# Patient Record
Sex: Female | Born: 1955 | Race: White | Hispanic: No | Marital: Married | Smoking: Never smoker
Health system: Southern US, Community
[De-identification: ages and names within clinical notes are randomized; demographics above are authoritative.]

## PROBLEM LIST (undated history)

## (undated) DIAGNOSIS — F419 Anxiety disorder, unspecified: Secondary | ICD-10-CM

## (undated) DIAGNOSIS — F329 Major depressive disorder, single episode, unspecified: Secondary | ICD-10-CM

## (undated) DIAGNOSIS — M1990 Unspecified osteoarthritis, unspecified site: Secondary | ICD-10-CM

## (undated) DIAGNOSIS — R42 Dizziness and giddiness: Secondary | ICD-10-CM

## (undated) DIAGNOSIS — F32A Depression, unspecified: Secondary | ICD-10-CM

## (undated) DIAGNOSIS — K219 Gastro-esophageal reflux disease without esophagitis: Secondary | ICD-10-CM

## (undated) DIAGNOSIS — I10 Essential (primary) hypertension: Secondary | ICD-10-CM

## (undated) DIAGNOSIS — R519 Headache, unspecified: Secondary | ICD-10-CM

## (undated) DIAGNOSIS — K589 Irritable bowel syndrome without diarrhea: Secondary | ICD-10-CM

## (undated) DIAGNOSIS — E785 Hyperlipidemia, unspecified: Secondary | ICD-10-CM

## (undated) HISTORY — PX: OTHER SURGICAL HISTORY: SHX169

## (undated) HISTORY — PX: ROTATOR CUFF REPAIR: SHX139

## (undated) HISTORY — PX: TUBAL LIGATION: SHX77

---
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
## 1993-07-07 HISTORY — PX: TUBAL LIGATION: SHX77

## 1996-07-07 HISTORY — PX: LEEP: SHX91

## 2010-07-07 HISTORY — PX: KNEE ARTHROSCOPY: SUR90

## 2011-03-13 ENCOUNTER — Ambulatory Visit: Payer: Self-pay | Admitting: General Surgery

## 2012-03-09 ENCOUNTER — Ambulatory Visit: Payer: Self-pay | Admitting: General Surgery

## 2012-03-09 LAB — CREATININE, SERUM
EGFR (African American): >60
EGFR (Non-African Amer.): 56 — ABNORMAL LOW

## 2014-04-13 LAB — BASIC METABOLIC PANEL
BUN: 16 mg/dL (ref 4–21)
Creatinine: 1 mg/dL (ref 0.5–1.1)
Glucose: 95 mg/dL
POTASSIUM: 4 mmol/L (ref 3.4–5.3)
SODIUM: 140 mmol/L (ref 137–147)

## 2014-05-30 LAB — HEPATIC FUNCTION PANEL
ALT: 13 U/L (ref 7–35)
AST: 18 U/L (ref 13–35)
Alkaline Phosphatase: 106 U/L (ref 25–125)

## 2014-05-30 LAB — LIPID PANEL
CHOLESTEROL: 162 mg/dL (ref 0–200)
HDL: 57 mg/dL (ref 35–70)
LDL Cholesterol: 87 mg/dL
LDL/HDL RATIO: 1.5
Triglycerides: 90 mg/dL (ref 40–160)

## 2015-02-08 ENCOUNTER — Telehealth: Payer: Self-pay | Admitting: Family Medicine

## 2015-02-08 NOTE — Telephone Encounter (Signed)
Pt requesting medication 

## 2015-03-26 ENCOUNTER — Ambulatory Visit: Payer: Self-pay | Admitting: Family Medicine

## 2015-03-29 DIAGNOSIS — F329 Major depressive disorder, single episode, unspecified: Secondary | ICD-10-CM | POA: Insufficient documentation

## 2015-03-29 DIAGNOSIS — G4700 Insomnia, unspecified: Secondary | ICD-10-CM | POA: Insufficient documentation

## 2015-03-29 DIAGNOSIS — F419 Anxiety disorder, unspecified: Secondary | ICD-10-CM

## 2015-03-29 DIAGNOSIS — K589 Irritable bowel syndrome without diarrhea: Secondary | ICD-10-CM | POA: Insufficient documentation

## 2015-03-29 DIAGNOSIS — E785 Hyperlipidemia, unspecified: Secondary | ICD-10-CM | POA: Insufficient documentation

## 2015-03-29 DIAGNOSIS — I10 Essential (primary) hypertension: Secondary | ICD-10-CM | POA: Insufficient documentation

## 2015-03-29 DIAGNOSIS — K219 Gastro-esophageal reflux disease without esophagitis: Secondary | ICD-10-CM | POA: Insufficient documentation

## 2015-03-29 DIAGNOSIS — G43909 Migraine, unspecified, not intractable, without status migrainosus: Secondary | ICD-10-CM | POA: Insufficient documentation

## 2015-03-29 DIAGNOSIS — F32A Depression, unspecified: Secondary | ICD-10-CM | POA: Insufficient documentation

## 2015-04-25 ENCOUNTER — Ambulatory Visit (INDEPENDENT_AMBULATORY_CARE_PROVIDER_SITE_OTHER): Payer: 59 | Admitting: Family Medicine

## 2015-04-25 VITALS — BP 120/82 | HR 74 | Temp 98.1°F | Resp 16 | Wt 225.0 lb

## 2015-04-25 DIAGNOSIS — I10 Essential (primary) hypertension: Secondary | ICD-10-CM | POA: Diagnosis not present

## 2015-04-25 DIAGNOSIS — F32A Depression, unspecified: Secondary | ICD-10-CM

## 2015-04-25 DIAGNOSIS — E785 Hyperlipidemia, unspecified: Secondary | ICD-10-CM | POA: Diagnosis not present

## 2015-04-25 DIAGNOSIS — Z23 Encounter for immunization: Secondary | ICD-10-CM | POA: Diagnosis not present

## 2015-04-25 DIAGNOSIS — K219 Gastro-esophageal reflux disease without esophagitis: Secondary | ICD-10-CM | POA: Diagnosis not present

## 2015-04-25 DIAGNOSIS — F329 Major depressive disorder, single episode, unspecified: Secondary | ICD-10-CM

## 2015-04-25 DIAGNOSIS — F419 Anxiety disorder, unspecified: Secondary | ICD-10-CM

## 2015-04-25 DIAGNOSIS — F418 Other specified anxiety disorders: Secondary | ICD-10-CM

## 2015-04-25 NOTE — Progress Notes (Signed)
Patient ID: Danielle Walsh, female   DOB: 01/01/1956, 59 y.o.   MRN: [id number]    Subjective:  HPI  Hypertension, follow-up:  BP Readings from Last 3 Encounters:  04/25/15 120/82    She was last seen for hypertension 6 months ago.  BP at that visit was 118/82. Management since that visit includes none. She reports good compliance with treatment. She is not having side effects.  She is exercising. Outside blood pressures are 120's70's. Patient denies chest pain, chest pressure/discomfort, dyspnea, exertional chest pressure/discomfort, fatigue and palpitations.     Wt Readings from Last 3 Encounters:  04/25/15 225 lb (102.059 kg)    Patient was working 9-5 and now is working 7-3. She is tired and does not like these earlier hours. She is trying to adjust with it with her lifestyle.  ------------------------------------------------------------------------    Lipid/Cholesterol, Follow-up:   Last seen for this6 months ago.  Management changes since that visit include none. . Last Lipid Panel:    Component Value Date/Time   CHOL 162 05/30/2014   TRIG 90 05/30/2014   HDL 57 05/30/2014   LDLCALC 87 05/30/2014    She reports good compliance with treatment. She is not having side effects.  Current symptoms include none. Weight trend: stable  Wt Readings from Last 3 Encounters:  04/25/15 225 lb (102.059 kg)    -------------------------------------------------------------------   Prior to Admission medications   Medication Sig Start Date End Date Taking? Authorizing Provider  ALPRAZolam Prudy Feeler(XANAX) 0.5 MG tablet Take by mouth. 09/25/14  Yes Historical Provider, MD  atorvastatin (LIPITOR) 20 MG tablet Take by mouth. 09/25/14  Yes Historical Provider, MD  cyclobenzaprine (FLEXERIL) 10 MG tablet Take by mouth. 11/30/14  Yes Historical Provider, MD  DULoxetine (CYMBALTA) 30 MG capsule Take by mouth. 09/25/14  Yes Historical Provider, MD  hyoscyamine (LEVBID) 0.375 MG 12 hr  tablet Take by mouth. 12/08/11  Yes Historical Provider, MD  losartan-hydrochlorothiazide Mauri Reading(HYZAAR) 50-12.5 MG per tablet Take by mouth. 09/25/14  Yes Historical Provider, MD  meclizine (ANTIVERT) 25 MG tablet Take by mouth. 10/24/13  Yes Historical Provider, MD  Omega-3 Fatty Acids (FISH OIL) 1200 MG CAPS Take by mouth.   Yes Historical Provider, MD  Omeprazole 20 MG TBEC Take by mouth. 09/25/14  Yes Historical Provider, MD  temazepam (RESTORIL) 30 MG capsule Take by mouth. 09/25/14  Yes Historical Provider, MD    Patient Active Problem List   Diagnosis Date Noted  . Anxiety and depression 03/29/2015  . Essential (primary) hypertension 03/29/2015  . Gastro-esophageal reflux disease without esophagitis 03/29/2015  . HLD (hyperlipidemia) 03/29/2015  . 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 Cannot sleep 03/29/2015  . Headache, migraine 03/29/2015    No past medical history on file.  Social History   Social History  . Marital Status: Married    Spouse Name: N/A  . Number of Children: N/A  . Years of Education: N/A   Occupational History  . Not on file.   Social History Main Topics  . Smoking status: Not on file  . Smokeless tobacco: Not on file  . Alcohol Use: Not on file  . Drug Use: Not on file  . Sexual Activity: Not on file   Other Topics Concern  . Not on file   Social History Narrative  . No narrative on file    Allergies  Allergen Reactions  . Hydrocodone-Acetaminophen   . Penicillins     Review of Systems  Constitutional: Negative.   HENT: Negative.  Eyes: Negative.   Respiratory: Negative.   Cardiovascular: Negative.   Gastrointestinal: Negative.   Genitourinary: Negative.   Musculoskeletal: Negative.   Skin: Negative.   Neurological: Negative.   Endo/Heme/Allergies: Negative.   Psychiatric/Behavioral: Negative.     There is no immunization history for the selected administration types on file for this patient. Objective:  BP 120/82 mmHg  Pulse 74   Temp(Src) 98.1 F (36.7 C) (Oral)  Resp 16  Wt 225 lb (102.059 kg)  Physical Exam  Constitutional: She is oriented to person, place, and time and well-developed, well-nourished, and in no distress.  HENT:  Head: Normocephalic and atraumatic.  Right Ear: External ear normal.  Left Ear: External ear normal.  Nose: Nose normal.  Eyes: Conjunctivae are normal.  Neck: Neck supple.  Cardiovascular: Normal rate, regular rhythm and normal heart sounds.   Pulmonary/Chest: Effort normal and breath sounds normal.  Abdominal: Soft.  Neurological: She is alert and oriented to person, place, and time.  Skin: Skin is warm and dry.  Psychiatric: Mood, memory, affect and judgment normal.    Lab Results  Component Value Date   CHOL 162 05/30/2014   TRIG 90 05/30/2014   HDL 57 05/30/2014   LDLCALC 87 05/30/2014    CMP     Component Value Date/Time   NA 140 04/13/2014   K 4.0 04/13/2014   BUN 16 04/13/2014   CREATININE 1.0 04/13/2014   CREATININE 1.10 03/09/2012 1743   AST 18 05/30/2014   ALT 13 05/30/2014   ALKPHOS 106 05/30/2014   GFRNONAA 56* 03/09/2012 1743   GFRAA >60 03/09/2012 1743    Assessment and Plan :  1. Essential (primary) hypertension Good control. - CBC With Differential - Comprehensive Metabolic Panel (CMET) - TSH  2. Gastro-esophageal reflux disease without esophagitis   3. HLD (hyperlipidemia)  - Comprehensive Metabolic Panel (CMET) - TSH - Lipid panel  4. Anxiety and depression Presently stable and in remission. No changes in meds.  5. Need for influenza vaccination  - Flu Vaccine QUAD 36+ mos IM   Julieanne Manson MD [facility] 04/25/2015 4:44 PM

## 2015-08-21 ENCOUNTER — Encounter: Payer: Self-pay | Admitting: Family Medicine

## 2015-08-21 ENCOUNTER — Other Ambulatory Visit: Payer: Self-pay | Admitting: Family Medicine

## 2015-08-21 ENCOUNTER — Ambulatory Visit (INDEPENDENT_AMBULATORY_CARE_PROVIDER_SITE_OTHER): Payer: 59 | Admitting: Family Medicine

## 2015-08-21 VITALS — BP 128/84 | HR 84 | Temp 98.1°F | Resp 16

## 2015-08-21 DIAGNOSIS — J0110 Acute frontal sinusitis, unspecified: Secondary | ICD-10-CM

## 2015-08-21 DIAGNOSIS — J101 Influenza due to other identified influenza virus with other respiratory manifestations: Secondary | ICD-10-CM

## 2015-08-21 DIAGNOSIS — R05 Cough: Secondary | ICD-10-CM | POA: Diagnosis not present

## 2015-08-21 DIAGNOSIS — R059 Cough, unspecified: Secondary | ICD-10-CM

## 2015-08-21 LAB — POCT INFLUENZA A/B
Influenza A, POC: POSITIVE — AB
Influenza B, POC: NEGATIVE

## 2015-08-21 MED ORDER — BENZONATATE 100 MG PO CAPS: 100.0000 mg | ORAL_CAPSULE | Freq: Three times a day (TID) | ORAL | Status: DC | PRN

## 2015-08-21 MED ORDER — OSELTAMIVIR PHOSPHATE 75 MG PO CAPS: 75.0000 mg | ORAL_CAPSULE | Freq: Two times a day (BID) | ORAL | Status: DC

## 2015-08-21 MED ORDER — FLUTICASONE PROPIONATE 50 MCG/ACT NA SUSP
2.0000 | Freq: Every day | NASAL | Status: DC
Start: 2015-08-21 — End: 2015-10-25

## 2015-08-21 MED ORDER — ONDANSETRON HCL 4 MG PO TABS: 4.0000 mg | ORAL_TABLET | Freq: Three times a day (TID) | ORAL | Status: DC | PRN

## 2015-08-21 MED ORDER — ACETAMINOPHEN 500 MG PO TABS: 500.0000 mg | ORAL_TABLET | Freq: Four times a day (QID) | ORAL | Status: AC | PRN

## 2015-08-21 MED ORDER — IBUPROFEN 200 MG PO TABS
200.0000 mg | ORAL_TABLET | Freq: Four times a day (QID) | ORAL | Status: DC | PRN
Start: 2015-08-21 — End: 2017-09-15

## 2015-08-21 NOTE — Patient Instructions (Signed)
Alternate tylenol and ibuprofen for fever and body aches. Take tamiflu twice daily for 5 days to help with symptoms of the flu.    Use Mucinex DM as needed for cough. You can take this with tessalon perles for cough as well.  You can use supportive care at home to help with your symptoms. I have sent Mucinex DM to your pharmacy to help break up the congestion and soothe your cough. You can takes this twice daily.  I have also sent tesslon perles to your pharmacy to help with the cough- you can take these 3 times daily as needed. Honey is a natural cough suppressant- so add it to your tea in the morning.  If you have a humidifer, set that up in your bedroom at night.    Please seek immediate medical attention if you develop shortness of breath not relieve by inhaler, chest pain/tightness, fever > 103 F or other concerning symptoms.

## 2015-08-21 NOTE — Progress Notes (Signed)
Subjective:    Patient ID: Danielle Walsh, female    DOB: 1956-02-25, 60 y.o.   MRN: [id number]  HPI: Danielle Walsh is a 60 y.o. female presenting on 08/21/2015 for Cough   HPI  Pt presents for possible flu. Symptoms began with hacking cough on Saturday. Started to have HA and sore throat yesterday. Fever yesterday.  Runny nose. Body aches and nausea. Some sinus pressure.  Able to keep fluids down and eat. No trouble breathing or shortness of breath. Cough not productive of sputum. Home treatment: Zyrtec and tylenol. Took mucinex DM- helped with cough.  Did get flu shot this year.   History reviewed. No pertinent past medical history. Social History   Social History  . Marital Status: Married    Spouse Name: N/A  . Number of Children: N/A  . Years of Education: N/A   Occupational History  . Not on file.   Social History Main Topics  . Smoking status: Never Smoker   . Smokeless tobacco: Never Used  . Alcohol Use: Not on file  . Drug Use: Not on file  . Sexual Activity: Not on file   Other Topics Concern  . Not on file   Social History Narrative   Family History  Problem Relation Age of Onset  . Hypertension Mother   . Arthritis Mother   . Diabetes Brother   . Hyperlipidemia Brother   . Alzheimer's disease Father    Current Outpatient Prescriptions on File Prior to Visit  Medication Sig  . ALPRAZolam (XANAX) 0.5 MG tablet Take by mouth.  Marland Kitchen atorvastatin (LIPITOR) 20 MG tablet Take by mouth.  . cyclobenzaprine (FLEXERIL) 10 MG tablet Take by mouth.  . DULoxetine (CYMBALTA) 30 MG capsule Take by mouth.  . hyoscyamine (LEVBID) 0.375 MG 12 hr tablet Take by mouth.  . losartan-hydrochlorothiazide (HYZAAR) 50-12.5 MG per tablet Take by mouth.  . meclizine (ANTIVERT) 25 MG tablet Take by mouth.  . Omega-3 Fatty Acids (FISH OIL) 1200 MG CAPS Take by mouth.  . Omeprazole 20 MG TBEC Take by mouth.  . temazepam (RESTORIL) 30 MG capsule Take by mouth.   No current  facility-administered medications on file prior to visit.    Review of Systems  Constitutional: Positive for fever and chills.  HENT: Positive for rhinorrhea and sinus pressure.   Respiratory: Positive for cough. Negative for chest tightness, shortness of breath and wheezing.   Cardiovascular: Negative for chest pain, palpitations and leg swelling.  Gastrointestinal: Negative for nausea, vomiting and diarrhea.  Musculoskeletal: Negative for neck pain and neck stiffness.  Skin: Negative for color change and rash.  Neurological: Positive for headaches. Negative for dizziness and syncope.   Per HPI unless specifically indicated above     Objective:    BP 128/84 mmHg  Pulse 84  Temp(Src) 98.1 F (36.7 C) (Oral)  Resp 16  SpO2 100%  Wt Readings from Last 3 Encounters:  04/25/15 225 lb (102.059 kg)    Physical Exam  Constitutional: She is oriented to person, place, and time. She appears well-developed and well-nourished. She appears ill. No distress.  HENT:  Head: Normocephalic and atraumatic.  Right Ear: Hearing and tympanic membrane normal.  Left Ear: Hearing and tympanic membrane normal.  Nose: Mucosal edema and rhinorrhea present. Right sinus exhibits frontal sinus tenderness. Right sinus exhibits no maxillary sinus tenderness. Left sinus exhibits frontal sinus tenderness. Left sinus exhibits no maxillary sinus tenderness.  Mouth/Throat: Uvula is midline and mucous membranes are normal.  No uvula swelling. Posterior oropharyngeal erythema present. No posterior oropharyngeal edema or tonsillar abscesses.  Neck: Normal range of motion. No erythema present. No Brudzinski's sign and no Kernig's sign noted.  Cardiovascular: Normal rate and regular rhythm.  Exam reveals no gallop and no friction rub.   No murmur heard. Pulmonary/Chest: Effort normal and breath sounds normal. No respiratory distress. She has no decreased breath sounds. She has no wheezes. She has no rhonchi. She has no  rales. Chest wall is not dull to percussion. She exhibits no tenderness.  Lymphadenopathy:    She has cervical adenopathy.       Right cervical: Superficial cervical adenopathy present.       Left cervical: Superficial cervical adenopathy present.  Neurological: She is alert and oriented to person, place, and time.  Skin: Skin is warm and dry. No rash noted. She is not diaphoretic. No erythema. No pallor.  Psychiatric: She has a normal mood and affect. Her speech is normal and behavior is normal.   Results for orders placed or performed in visit on 08/21/15  POCT Influenza A/B  Result Value Ref Range   Influenza A, POC Positive (A) Negative   Influenza B, POC Negative Negative      Assessment & Plan:   Problem List Items Addressed This Visit    None    Visit Diagnoses    Cough    -  Primary    Continue mucinex DM and supportive care at home. Tessalon perles PRN.     Relevant Medications    benzonatate (TESSALON) 100 MG capsule    Other Relevant Orders    POCT Influenza A/B (Completed)    Influenza A        Trial of tamiflu despite questionable if beneficial due to 4 days of cough but fever starting yesterday. Alarm symptoms and risk of pneumonia reviewed. Supportive care reviewed. Alternate tylenol and ibuprofen as needed for pain and fever.     Relevant Medications    oseltamivir (TAMIFLU) 75 MG capsule    acetaminophen (TYLENOL) 500 MG tablet    ibuprofen (ADVIL) 200 MG tablet    Acute frontal sinusitis, recurrence not specified        Likely viral. Supportive care with mucinex at home. symptoms of ABRS reviewed, pt will call for persistent symptoms.     Relevant Medications    oseltamivir (TAMIFLU) 75 MG capsule    benzonatate (TESSALON) 100 MG capsule    fluticasone (FLONASE) 50 MCG/ACT nasal spray       Meds ordered this encounter  Medications  . oseltamivir (TAMIFLU) 75 MG capsule    Sig: Take 1 capsule (75 mg total) by mouth 2 (two) times daily.    Dispense:  10  capsule    Refill:  0    Order Specific Question:  Supervising Provider    Answer:  LING, FLESCH [phone]  . benzonatate (TESSALON) 100 MG capsule    Sig: Take 1 capsule (100 mg total) by mouth 3 (three) times daily as needed.    Dispense:  30 capsule    Refill:  0    Order Specific Question:  Supervising Provider    Answer:  LAKARA, WEILAND [[id number]]  . acetaminophen (TYLENOL) 500 MG tablet    Sig: Take 1 tablet (500 mg total) by mouth every 6 (six) hours as needed.    Dispense:  30 tablet    Refill:  0    Order Specific Question:  Supervising Provider  Answer:  RADIAH, LUBINSKI [[id number]]  . ibuprofen (ADVIL) 200 MG tablet    Sig: Take 1 tablet (200 mg total) by mouth every 6 (six) hours as needed.    Dispense:  30 tablet    Refill:  0    Order Specific Question:  Supervising Provider    Answer:  ELEANNA, THEILEN [phone]  . fluticasone (FLONASE) 50 MCG/ACT nasal spray    Sig: Place 2 sprays into both nostrils daily.    Dispense:  16 g    Refill:  11    Order Specific Question:  Supervising Provider    Answer:  Janeann Forehand [phone]      Follow up plan: Return if symptoms worsen or fail to improve.

## 2015-08-29 ENCOUNTER — Other Ambulatory Visit: Payer: Self-pay | Admitting: Family Medicine

## 2015-08-30 MED ORDER — ONDANSETRON HCL 4 MG PO TABS: 4.0000 mg | ORAL_TABLET | Freq: Three times a day (TID) | ORAL | Status: DC | PRN

## 2015-09-19 DIAGNOSIS — Z01419 Encounter for gynecological examination (general) (routine) without abnormal findings: Secondary | ICD-10-CM | POA: Diagnosis not present

## 2015-09-19 DIAGNOSIS — Z1151 Encounter for screening for human papillomavirus (HPV): Secondary | ICD-10-CM | POA: Diagnosis not present

## 2015-09-19 LAB — HM PAP SMEAR

## 2015-10-01 ENCOUNTER — Telehealth: Payer: Self-pay

## 2015-10-01 NOTE — Telephone Encounter (Signed)
[id number]-aa-need to know date of her mammogram-aa

## 2015-10-02 NOTE — Telephone Encounter (Signed)
Pt is returning call.  AO#[phone]/NGCB#[phone]/MW

## 2015-10-02 NOTE — Telephone Encounter (Signed)
Pt had mammogram on 09/19/15. Chart has been updated.

## 2015-10-25 ENCOUNTER — Ambulatory Visit (INDEPENDENT_AMBULATORY_CARE_PROVIDER_SITE_OTHER): Payer: 59 | Admitting: Family Medicine

## 2015-10-25 VITALS — BP 122/84 | HR 84 | Temp 98.7°F | Resp 16 | Wt 227.0 lb

## 2015-10-25 DIAGNOSIS — I10 Essential (primary) hypertension: Secondary | ICD-10-CM | POA: Diagnosis not present

## 2015-10-25 DIAGNOSIS — E785 Hyperlipidemia, unspecified: Secondary | ICD-10-CM | POA: Diagnosis not present

## 2015-10-25 DIAGNOSIS — F418 Other specified anxiety disorders: Secondary | ICD-10-CM

## 2015-10-25 DIAGNOSIS — F419 Anxiety disorder, unspecified: Secondary | ICD-10-CM

## 2015-10-25 DIAGNOSIS — F329 Major depressive disorder, single episode, unspecified: Secondary | ICD-10-CM

## 2015-10-25 DIAGNOSIS — F32A Depression, unspecified: Secondary | ICD-10-CM

## 2015-10-25 MED ORDER — TEMAZEPAM 30 MG PO CAPS: 30.0000 mg | ORAL_CAPSULE | Freq: Every day | ORAL | Status: DC

## 2015-10-25 MED ORDER — LOSARTAN POTASSIUM-HCTZ 50-12.5 MG PO TABS: 1.0000 | ORAL_TABLET | Freq: Every day | ORAL | Status: DC

## 2015-10-25 MED ORDER — DULOXETINE HCL 60 MG PO CPEP: 60.0000 mg | ORAL_CAPSULE | Freq: Every day | ORAL | Status: DC

## 2015-10-25 MED ORDER — ALPRAZOLAM 0.5 MG PO TABS: 0.5000 mg | ORAL_TABLET | Freq: Two times a day (BID) | ORAL | Status: DC | PRN

## 2015-10-25 MED ORDER — OMEPRAZOLE 20 MG PO TBEC: 1.0000 | DELAYED_RELEASE_TABLET | Freq: Every day | ORAL | Status: DC

## 2015-10-25 MED ORDER — ATORVASTATIN CALCIUM 20 MG PO TABS: 20.0000 mg | ORAL_TABLET | Freq: Every day | ORAL | Status: DC

## 2015-10-25 NOTE — Progress Notes (Signed)
Patient ID: Danielle Walsh, female   DOB: 11/21/1955, 60 y.o.   MRN: [id number]   Danielle Walsh  MRN: [id number] DOB: 08/03/1955  Subjective:  HPI   The patient is a 60 year old female who presents for follow up of chronic issues.  She has not had her labs done from the last visit.  She was last seen on 04/25/15 and her blood pressure at that time was 128/84.  She is having no adverse effects from the medicine.  She complains that her depression is worsening.  She feels that she does not want to do anything, or go anywhere.  Patient Active Problem List   Diagnosis Date Noted  . Anxiety and depression 03/29/2015  . Essential (primary) hypertension 03/29/2015  . Gastro-esophageal reflux disease without esophagitis 03/29/2015  . HLD (hyperlipidemia) 03/29/2015  . 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 Cannot sleep 03/29/2015  . Headache, migraine 03/29/2015    No past medical history on file.  Social History   Social History  . Marital Status: Married    Spouse Name: N/A  . Number of Children: N/A  . Years of Education: N/A   Occupational History  . Not on file.   Social History Main Topics  . Smoking status: Never Smoker   . Smokeless tobacco: Never Used  . Alcohol Use: Not on file  . Drug Use: Not on file  . Sexual Activity: Not on file   Other Topics Concern  . Not on file   Social History Narrative    Outpatient Prescriptions Prior to Visit  Medication Sig Dispense Refill  . acetaminophen (TYLENOL) 500 MG tablet Take 1 tablet (500 mg total) by mouth every 6 (six) hours as needed. 30 tablet 0  . ALPRAZolam (XANAX) 0.5 MG tablet Take by mouth.    Marland Kitchen. atorvastatin (LIPITOR) 20 MG tablet Take by mouth.    . cyclobenzaprine (FLEXERIL) 10 MG tablet Take by mouth.    . DULoxetine (CYMBALTA) 30 MG capsule Take by mouth.    . hyoscyamine (LEVBID) 0.375 MG 12 hr tablet Take by mouth.    Marland Kitchen. ibuprofen (ADVIL) 200 MG tablet Take 1 tablet (200 mg total) by mouth every 6 (six) hours as  needed. 30 tablet 0  . losartan-hydrochlorothiazide (HYZAAR) 50-12.5 MG per tablet Take by mouth.    . meclizine (ANTIVERT) 25 MG tablet Take by mouth.    . Omega-3 Fatty Acids (FISH OIL) 1200 MG CAPS Take by mouth.    . Omeprazole 20 MG TBEC Take by mouth.    . temazepam (RESTORIL) 30 MG capsule Take by mouth.    . fluticasone (FLONASE) 50 MCG/ACT nasal spray Place 2 sprays into both nostrils daily. 16 g 11  . ondansetron (ZOFRAN) 4 MG tablet Take 1 tablet (4 mg total) by mouth every 8 (eight) hours as needed for nausea or vomiting. 20 tablet 0  . benzonatate (TESSALON) 100 MG capsule Take 1 capsule (100 mg total) by mouth 3 (three) times daily as needed. 30 capsule 0  . oseltamivir (TAMIFLU) 75 MG capsule Take 1 capsule (75 mg total) by mouth 2 (two) times daily. 10 capsule 0   No facility-administered medications prior to visit.    Allergies  Allergen Reactions  . Hydrocodone-Acetaminophen   . Penicillins     Review of Systems  Constitutional: Positive for malaise/fatigue. Negative for fever.  Respiratory: Negative for cough, shortness of breath and wheezing.   Cardiovascular: Positive for palpitations. Negative for chest pain, orthopnea and leg  swelling.  Neurological: Positive for headaches. Negative for weakness.  Psychiatric/Behavioral: Positive for depression. Negative for suicidal ideas, hallucinations, memory loss and substance abuse. The patient is nervous/anxious and has insomnia (takes medicine when needed).    Objective:  BP 122/84 mmHg  Pulse 84  Temp(Src) 98.7 F (37.1 C) (Oral)  Resp 16  Wt 227 lb (102.967 kg)  Physical Exam  Assessment and Plan :    1. Essential (primary) hypertension   2. HLD (hyperlipidemia)   3. Anxiety and depression Patient very willing to double duloxetine to 60 mg. It is of note that she has been on up to 90 mg in the past. Turn to clinic in June. - DULoxetine (CYMBALTA) 60 MG capsule; Take 1 capsule (60 mg total) by mouth  daily.  Dispense: 30 capsule; Refill: 12 - temazepam (RESTORIL) 30 MG capsule; Take 1 capsule (30 mg total) by mouth at bedtime.  Dispense: 30 capsule; Refill: 5 - ALPRAZolam (XANAX) 0.5 MG tablet; Take 1 tablet (0.5 mg total) by mouth 2 (two) times daily as needed for anxiety.  Dispense: 60 tablet; Refill: 5 I have done the exam and reviewed the above chart and it is accurate to the best of my knowledge.  Julieanne Manson MD [facility] 10/25/2015 4:20 PM

## 2015-11-02 ENCOUNTER — Other Ambulatory Visit: Payer: Self-pay | Admitting: Family Medicine

## 2015-11-02 DIAGNOSIS — I10 Essential (primary) hypertension: Secondary | ICD-10-CM | POA: Diagnosis not present

## 2015-11-02 DIAGNOSIS — E785 Hyperlipidemia, unspecified: Secondary | ICD-10-CM | POA: Diagnosis not present

## 2015-11-03 LAB — COMPREHENSIVE METABOLIC PANEL
ALK PHOS: 100 IU/L (ref 39–117)
ALT: 13 IU/L (ref 0–32)
AST: 23 IU/L (ref 0–40)
Albumin/Globulin Ratio: 1.6 (ref 1.2–2.2)
Albumin: 4.6 g/dL (ref 3.5–5.5)
BILIRUBIN TOTAL: 0.8 mg/dL (ref 0.0–1.2)
BUN / CREAT RATIO: 13 (ref 9–23)
BUN: 15 mg/dL (ref 6–24)
CALCIUM: 9.8 mg/dL (ref 8.7–10.2)
CHLORIDE: 98 mmol/L (ref 96–106)
CO2: 24 mmol/L (ref 18–29)
Creatinine, Ser: 1.16 mg/dL — ABNORMAL HIGH (ref 0.57–1.00)
GFR calc Af Amer: 60 mL/min/{1.73_m2} (ref >59)
GFR calc non Af Amer: 52 mL/min/{1.73_m2} — ABNORMAL LOW (ref >59)
GLOBULIN, TOTAL: 2.8 g/dL (ref 1.5–4.5)
GLUCOSE: 106 mg/dL — AB (ref 65–99)
POTASSIUM: 3.9 mmol/L (ref 3.5–5.2)
SODIUM: 139 mmol/L (ref 134–144)
Total Protein: 7.4 g/dL (ref 6.0–8.5)

## 2015-11-03 LAB — CBC WITH DIFFERENTIAL
BASOS ABS: 0.1 10*3/uL (ref 0.0–0.2)
Basos: 1 %
EOS (ABSOLUTE): 0.1 10*3/uL (ref 0.0–0.4)
EOS: 1 %
HEMATOCRIT: 41.6 % (ref 34.0–46.6)
HEMOGLOBIN: 13.9 g/dL (ref 11.1–15.9)
IMMATURE GRANULOCYTES: 0 %
Immature Grans (Abs): 0 10*3/uL (ref 0.0–0.1)
LYMPHS ABS: 2 10*3/uL (ref 0.7–3.1)
Lymphs: 26 %
MCH: 31.3 pg (ref 26.6–33.0)
MCHC: 33.4 g/dL (ref 31.5–35.7)
MCV: 94 fL (ref 79–97)
MONOCYTES: 7 %
MONOS ABS: 0.5 10*3/uL (ref 0.1–0.9)
Neutrophils Absolute: 4.8 10*3/uL (ref 1.4–7.0)
Neutrophils: 65 %
RBC: 4.44 x10E6/uL (ref 3.77–5.28)
RDW: 13.3 % (ref 12.3–15.4)
WBC: 7.4 10*3/uL (ref 3.4–10.8)

## 2015-11-03 LAB — LIPID PANEL
CHOLESTEROL TOTAL: 159 mg/dL (ref 100–199)
Chol/HDL Ratio: 2.9 ratio units (ref 0.0–4.4)
HDL: 55 mg/dL (ref >39)
LDL Calculated: 86 mg/dL (ref 0–99)
Triglycerides: 92 mg/dL (ref 0–149)
VLDL Cholesterol Cal: 18 mg/dL (ref 5–40)

## 2015-11-03 LAB — TSH: TSH: 2.92 u[IU]/mL (ref 0.450–4.500)

## 2015-11-06 ENCOUNTER — Telehealth: Payer: Self-pay

## 2015-11-06 NOTE — Telephone Encounter (Signed)
Patient advised  ED 

## 2015-11-06 NOTE — Telephone Encounter (Signed)
-----   Message from Maple Hudsonichard L Gilbert Jr., MD sent at 11/06/2015  8:07 AM EDT ----- Labs okay.

## 2015-11-15 ENCOUNTER — Telehealth: Payer: Self-pay | Admitting: Family Medicine

## 2015-11-15 NOTE — Telephone Encounter (Signed)
Danielle MessierKathy said that when Danielle AshingJim looked at her labs he was a little concerned about her GFR.  He thought maybe she should change from Danielle Walsh to Danielle Walsh to remove the diuretic.  I spoke with Danielle MessierKathy and asked her about fluid intake and talked about the other kidney function levels.  She said she does not want to change the medicine unless you really think she should.  She has done well on the Danielle Walsh.  Also she said to let you know she is doing much better on the increased dose of Danielle Walsh.

## 2015-11-15 NOTE — Telephone Encounter (Signed)
Pt request that Michelle Nasutilena return her call. Pt has a question about the level of one of her kidney function labs. Thanks TNP

## 2015-11-27 ENCOUNTER — Encounter: Payer: Self-pay | Admitting: Family Medicine

## 2015-11-27 ENCOUNTER — Ambulatory Visit (INDEPENDENT_AMBULATORY_CARE_PROVIDER_SITE_OTHER): Payer: 59 | Admitting: Family Medicine

## 2015-11-27 VITALS — BP 114/88 | HR 90 | Temp 98.4°F | Resp 18 | Wt 224.0 lb

## 2015-11-27 DIAGNOSIS — J0100 Acute maxillary sinusitis, unspecified: Secondary | ICD-10-CM | POA: Diagnosis not present

## 2015-11-27 DIAGNOSIS — J040 Acute laryngitis: Secondary | ICD-10-CM | POA: Diagnosis not present

## 2015-11-27 MED ORDER — AZITHROMYCIN 250 MG PO TABS: ORAL_TABLET | ORAL | Status: DC

## 2015-11-27 NOTE — Progress Notes (Signed)
Patient ID: Danielle Walsh, female   DOB: [birth year], 60 y.o.   MRN: [id number]       Patient: Danielle Walsh Female    DOB: 04-10-1956   60 y.o.   MRN: [id number] Visit Date: 11/27/2015  Today's Provider: Dortha Kern, PA   Chief Complaint  Patient presents with  . Cough   Subjective:    HPI  Patient has not felt good for 3 days now, started Sunday May 21st. Symptoms are post nasal drip, head and nasal congestion, hoarse, fatigue, chills, cough productive with thick brownish phlegm, achy feeling. She started taking Doxy she had at home and started to feel better yesterday but when she woke up this morning felt achy again. She has taking Mucinex DM also. Symptoms worse at night and early morning. Some wheeze when she lies down. Mother in Hospice care for suspected stroke at age 26.   No past medical history on file. Patient Active Problem List   Diagnosis Date Noted  . Anxiety and depression 03/29/2015  . Essential (primary) hypertension 03/29/2015  . Gastro-esophageal reflux disease without esophagitis 03/29/2015  . HLD (hyperlipidemia) 03/29/2015  . 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 Cannot sleep 03/29/2015  . Headache, migraine 03/29/2015   Past Surgical History  Procedure Laterality Date  . Other surgical history      thermal ablasion-uterine area due to abnormal bleeding  . Tubal ligation    . Rotator cuff repair     Family History  Problem Relation Age of Onset  . Hypertension Mother   . Arthritis Mother   . Diabetes Brother   . Hyperlipidemia Brother   . Alzheimer's disease Father    Allergies  Allergen Reactions  . Hydrocodone-Acetaminophen   . Penicillins    Previous Medications   ACETAMINOPHEN (TYLENOL) 500 MG TABLET    Take 1 tablet (500 mg total) by mouth every 6 (six) hours as needed.   ALPRAZOLAM (XANAX) 0.5 MG TABLET    Take 1 tablet (0.5 mg total) by mouth 2 (two) times daily as needed for anxiety.   ATORVASTATIN (LIPITOR) 20 MG TABLET    Take 1  tablet (20 mg total) by mouth daily at 6 PM.   CYCLOBENZAPRINE (FLEXERIL) 10 MG TABLET    Take by mouth.   DULOXETINE (CYMBALTA) 30 MG CAPSULE    Take by mouth.   DULOXETINE (CYMBALTA) 60 MG CAPSULE    Take 1 capsule (60 mg total) by mouth daily.   HYOSCYAMINE (LEVBID) 0.375 MG 12 HR TABLET    Take by mouth.   IBUPROFEN (ADVIL) 200 MG TABLET    Take 1 tablet (200 mg total) by mouth every 6 (six) hours as needed.   LOSARTAN-HYDROCHLOROTHIAZIDE (HYZAAR) 50-12.5 MG TABLET    Take 1 tablet by mouth daily.   MECLIZINE (ANTIVERT) 25 MG TABLET    Take by mouth.   OMEGA-3 FATTY ACIDS (FISH OIL) 1200 MG CAPS    Take by mouth.   OMEPRAZOLE 20 MG TBEC    Take 1 tablet (20 mg total) by mouth daily.   TEMAZEPAM (RESTORIL) 30 MG CAPSULE    Take 1 capsule (30 mg total) by mouth at bedtime.    Review of Systems  Constitutional: Positive for chills, activity change, appetite change and fatigue.  HENT: Positive for congestion, postnasal drip, sore throat, trouble swallowing and voice change.   Respiratory: Positive for cough and chest tightness. Negative for shortness of breath.   Cardiovascular: Negative.   Musculoskeletal: Positive for myalgias and  arthralgias.  Neurological: Positive for headaches.    Social History  Substance Use Topics  . Smoking status: Never Smoker   . Smokeless tobacco: Never Used  . Alcohol Use: Not on file   Objective:   BP 114/88 mmHg  Pulse 90  Temp(Src) 98.4 F (36.9 C)  Resp 18  Wt 224 lb (101.606 kg)  SpO2 96%  Physical Exam  Constitutional: She is oriented to person, place, and time. She appears well-developed and well-nourished.  HENT:  Head: Normocephalic.  Right Ear: External ear normal.  Left Ear: External ear normal.  Nose: Nose normal.  Cobblestone appearance of posterior pharynx. No exudates. Tender maxillary sinuses with no transillumination.  Eyes: Conjunctivae and EOM are normal.  Neck: Normal range of motion. Neck supple.  Cardiovascular:  Normal rate and regular rhythm.   Pulmonary/Chest: Effort normal and breath sounds normal.  Abdominal: Soft. Bowel sounds are normal.  Neurological: She is alert and oriented to person, place, and time.  Skin: No rash noted.      Assessment & Plan:     1. Acute maxillary sinusitis, recurrence not specified Onset over the past 2-3 days without fever. Having post nasal drip causing sore throat and cough. Sinuses are tender with no transillumination through maxillary sinuses. Will treat with Z-pak and continue Mucinex-DM with antihistamine. Increase fluids and may use Tylenol or Advil prn aches or pains. Recheck if no better in 5-7 days. - azithromycin (ZITHROMAX) 250 MG tablet; Take 2 tablets by mouth today then one tablet daily for 4 days.  Dispense: 6 tablet; Refill: 0  2. Laryngitis Onset with sinusitis and post nasal drip. Rest voice and gargle with warm saltwater prn. Recheck prn.     Dortha Kernennis Chrismon, PA  [facility]

## 2015-11-29 ENCOUNTER — Telehealth: Payer: Self-pay

## 2015-11-29 MED ORDER — PREDNISONE 10 MG PO TABS: ORAL_TABLET | ORAL | Status: DC

## 2015-11-29 NOTE — Telephone Encounter (Signed)
6 day prednisone taper. Thanks.

## 2015-11-29 NOTE — Telephone Encounter (Signed)
Patient's husband called, Dr Juanetta GoslingHawkins, stating that patient was seen this week by Maurine Ministerennis, her cough a little better but her laryngitis is not. She has hard time talking without "squiling". Patient's mother passed and they are trying to make arrangements and have visitation tomorrow night and she is trying to have a better voice, she has hard time resting her voice right now with everything she needs to do. Husband wanted to know if she could have a short term of Prednisone or something to help her. Please advise since Maurine MinisterDennis not here, thank you-aa

## 2015-11-29 NOTE — Telephone Encounter (Signed)
Patient's husband advised, please send in thank you-aa

## 2015-12-04 ENCOUNTER — Other Ambulatory Visit: Payer: Self-pay | Admitting: Family Medicine

## 2015-12-04 DIAGNOSIS — F329 Major depressive disorder, single episode, unspecified: Secondary | ICD-10-CM

## 2015-12-04 DIAGNOSIS — F32A Depression, unspecified: Secondary | ICD-10-CM

## 2015-12-04 DIAGNOSIS — F419 Anxiety disorder, unspecified: Principal | ICD-10-CM

## 2015-12-04 MED ORDER — LOSARTAN POTASSIUM-HCTZ 50-12.5 MG PO TABS: 1.0000 | ORAL_TABLET | Freq: Every day | ORAL | Status: DC

## 2015-12-04 MED ORDER — ATORVASTATIN CALCIUM 20 MG PO TABS: 20.0000 mg | ORAL_TABLET | Freq: Every day | ORAL | Status: DC

## 2015-12-04 MED ORDER — OMEPRAZOLE 20 MG PO TBEC: 1.0000 | DELAYED_RELEASE_TABLET | Freq: Every day | ORAL | Status: DC

## 2015-12-04 MED ORDER — DULOXETINE HCL 60 MG PO CPEP: 60.0000 mg | ORAL_CAPSULE | Freq: Every day | ORAL | Status: DC

## 2015-12-04 NOTE — Telephone Encounter (Signed)
Pt stated that the following rx were supposed to go to [facility] but it looks like they were sent to another pharmacy. Pt would [facility] today if possible. Thanks TNP

## 2015-12-04 NOTE — Telephone Encounter (Signed)
Done-aa 

## 2015-12-07 ENCOUNTER — Ambulatory Visit (INDEPENDENT_AMBULATORY_CARE_PROVIDER_SITE_OTHER): Payer: 59 | Admitting: Family Medicine

## 2015-12-07 ENCOUNTER — Encounter: Payer: Self-pay | Admitting: Family Medicine

## 2015-12-07 VITALS — BP 100/82 | HR 89 | Temp 98.9°F | Resp 16 | Wt 229.0 lb

## 2015-12-07 DIAGNOSIS — J0100 Acute maxillary sinusitis, unspecified: Secondary | ICD-10-CM | POA: Diagnosis not present

## 2015-12-07 MED ORDER — LEVOFLOXACIN 500 MG PO TABS: 500.0000 mg | ORAL_TABLET | Freq: Every day | ORAL | Status: DC

## 2015-12-07 NOTE — Progress Notes (Signed)
Subjective:     Patient ID: Kym GroomKathy L Dade, female   DOB: 05/27/1956, 60 y.o.   MRN: [id number]  HPI  Chief Complaint  Patient presents with  . Cough    Patient comes in office today with complaints of cough and sore throat since 11/26/15, patient was seen in office om 11/27/15. Patient reports that she finished Azithromycin and Prednisone but states that her symptoms have not improved, patient has been taking otc Mucinex D and DM for relief.   Continues to have sinus pressure and purulent sinus congestion, PND, and cough. Reports her mother passed away in the last week and she has been dealing with that as well.   Review of Systems     Objective:   Physical Exam  Constitutional: She appears well-developed and well-nourished. No distress.  Ears: T.M's intact without inflammation Sinuses: mild frontal and maxillary sinus tenderness Throat: no tonsillar enlargement or exudate Neck: bilateral anterior cervical nodes Lungs: clear     Assessment:    1. Acute maxillary sinusitis, recurrence not specified - levofloxacin (LEVAQUIN) 500 MG tablet; Take 1 tablet (500 mg total) by mouth daily.  Dispense: 10 tablet; Refill: 0    Plan:    Continue Mucinex D for congestion and Delsym for cough

## 2015-12-07 NOTE — Patient Instructions (Signed)
Continue Delsym and Mucinex D

## 2015-12-25 ENCOUNTER — Ambulatory Visit: Payer: 59 | Admitting: Family Medicine

## 2016-01-03 ENCOUNTER — Ambulatory Visit (INDEPENDENT_AMBULATORY_CARE_PROVIDER_SITE_OTHER): Payer: 59 | Admitting: Family Medicine

## 2016-01-03 VITALS — BP 140/88 | HR 76 | Temp 97.6°F | Resp 16 | Wt 225.0 lb

## 2016-01-03 DIAGNOSIS — I10 Essential (primary) hypertension: Secondary | ICD-10-CM | POA: Diagnosis not present

## 2016-01-03 DIAGNOSIS — F329 Major depressive disorder, single episode, unspecified: Secondary | ICD-10-CM

## 2016-01-03 DIAGNOSIS — F418 Other specified anxiety disorders: Secondary | ICD-10-CM

## 2016-01-03 DIAGNOSIS — G4700 Insomnia, unspecified: Secondary | ICD-10-CM | POA: Diagnosis not present

## 2016-01-03 DIAGNOSIS — F32A Depression, unspecified: Secondary | ICD-10-CM

## 2016-01-03 DIAGNOSIS — F419 Anxiety disorder, unspecified: Secondary | ICD-10-CM

## 2016-01-03 NOTE — Progress Notes (Signed)
Danielle GroomKathy L Walsh  MRN: [id number] DOB: 02/10/1956  Subjective:  HPI   The patient is a 60 year old female who presents for follow up of her depression.  Her last visit was on 10/25/15 and at that time she was instructed to increase her Cymbalta to 60 mg daily.  She states that she can not say if she was better, worse or the same because since the last visit her mother passed away.  She states that right now her biggest problem is her fatigue and insomnia.  She states she falls asleep ok but can not stay asleep.  She does admit that her mood and coping with things has improved.  Patient would like to have her renal function rechecked today due to the last one being slightly abnormal.  Patient Active Problem List   Diagnosis Date Noted  . Anxiety and depression 03/29/2015  . Essential (primary) hypertension 03/29/2015  . Gastro-esophageal reflux disease without esophagitis 03/29/2015  . HLD (hyperlipidemia) 03/29/2015  . 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 Cannot sleep 03/29/2015  . Headache, migraine 03/29/2015    No past medical history on file.  Social History   Social History  . Marital Status: Married    Spouse Name: N/A  . Number of Children: N/A  . Years of Education: N/A   Occupational History  . Not on file.   Social History Main Topics  . Smoking status: Never Smoker   . Smokeless tobacco: Never Used  . Alcohol Use: Not on file  . Drug Use: Not on file  . Sexual Activity: Not on file   Other Topics Concern  . Not on file   Social History Narrative    Outpatient Prescriptions Prior to Visit  Medication Sig Dispense Refill  . acetaminophen (TYLENOL) 500 MG tablet Take 1 tablet (500 mg total) by mouth every 6 (six) hours as needed. 30 tablet 0  . ALPRAZolam (XANAX) 0.5 MG tablet Take 1 tablet (0.5 mg total) by mouth 2 (two) times daily as needed for anxiety. 60 tablet 5  . atorvastatin (LIPITOR) 20 MG tablet Take 1 tablet (20 mg total) by mouth daily at 6 PM. 90  tablet 3  . cyclobenzaprine (FLEXERIL) 10 MG tablet Take by mouth.    . DULoxetine (CYMBALTA) 60 MG capsule Take 1 capsule (60 mg total) by mouth daily. 90 capsule 3  . hyoscyamine (LEVBID) 0.375 MG 12 hr tablet Take by mouth.    Marland Kitchen. ibuprofen (ADVIL) 200 MG tablet Take 1 tablet (200 mg total) by mouth every 6 (six) hours as needed. 30 tablet 0  . levofloxacin (LEVAQUIN) 500 MG tablet Take 1 tablet (500 mg total) by mouth daily. 10 tablet 0  . losartan-hydrochlorothiazide (HYZAAR) 50-12.5 MG tablet Take 1 tablet by mouth daily. 90 tablet 3  . meclizine (ANTIVERT) 25 MG tablet Take by mouth.    . Omega-3 Fatty Acids (FISH OIL) 1200 MG CAPS Take by mouth.    . Omeprazole 20 MG TBEC Take 1 tablet (20 mg total) by mouth daily. 90 each 3  . temazepam (RESTORIL) 30 MG capsule Take 1 capsule (30 mg total) by mouth at bedtime. 30 capsule 5   No facility-administered medications prior to visit.    Allergies  Allergen Reactions  . Hydrocodone-Acetaminophen   . Penicillins     Review of Systems  Constitutional: Positive for malaise/fatigue. Negative for fever.  Respiratory: Positive for cough. Negative for shortness of breath and wheezing.   Cardiovascular: Positive for leg swelling (  mostly her ankles and hands in the morning). Negative for chest pain and palpitations.  Gastrointestinal: Negative.   Skin: Negative.   Neurological: Positive for headaches (stress related). Negative for dizziness and weakness.  Endo/Heme/Allergies: Negative.   Psychiatric/Behavioral: Positive for depression. Negative for suicidal ideas, hallucinations, memory loss and substance abuse. The patient is nervous/anxious and has insomnia.    Objective:  BP 140/88 mmHg  Pulse 76  Temp(Src) 97.6 F (36.4 C) (Oral)  Resp 16  Wt 225 lb (102.059 kg)  Physical Exam  Constitutional: She is oriented to person, place, and time and well-developed, well-nourished, and in no distress.  HENT:  Head: Normocephalic and  atraumatic.  Right Ear: External ear normal.  Left Ear: External ear normal.  Nose: Nose normal.  Eyes: Conjunctivae are normal.  Neck: Neck supple. No thyromegaly present.  Cardiovascular: Normal rate, regular rhythm and normal heart sounds.   Pulmonary/Chest: Effort normal and breath sounds normal.  Abdominal: Soft.  Neurological: She is alert and oriented to person, place, and time. Gait normal.  Skin: Skin is warm and dry.  Psychiatric: Mood, memory, affect and judgment normal.    Assessment and Plan :   1. Anxiety and depression Improving on increased Cymbalta.  Continue the same dose and follow up in 3 months  2. Essential (primary) hypertension May change from Hyzaar to Cozaar, will await labs. - Renal function panel  3. Cannot sleep/chronic insomnia 4. Mild hypertensive nephropathy  I have done the exam and reviewed the above chart and it is accurate to the best of my knowledge.  Julieanne Mansonichard Gilbert MD [facility] 01/03/2016 1:57 PM

## 2016-01-04 ENCOUNTER — Telehealth: Payer: Self-pay | Admitting: Family Medicine

## 2016-01-04 ENCOUNTER — Other Ambulatory Visit: Payer: Self-pay

## 2016-01-04 LAB — RENAL FUNCTION PANEL
ALBUMIN: 4.6 g/dL (ref 3.5–5.5)
BUN/Creatinine Ratio: 11 (ref 9–23)
BUN: 12 mg/dL (ref 6–24)
CALCIUM: 9.9 mg/dL (ref 8.7–10.2)
CHLORIDE: 101 mmol/L (ref 96–106)
CO2: 24 mmol/L (ref 18–29)
Creatinine, Ser: 1.11 mg/dL — ABNORMAL HIGH (ref 0.57–1.00)
GFR calc Af Amer: 63 mL/min/{1.73_m2} (ref >59)
GFR calc non Af Amer: 54 mL/min/{1.73_m2} — ABNORMAL LOW (ref >59)
GLUCOSE: 101 mg/dL — AB (ref 65–99)
PHOSPHORUS: 3.4 mg/dL (ref 2.5–4.5)
POTASSIUM: 4 mmol/L (ref 3.5–5.2)
SODIUM: 144 mmol/L (ref 134–144)

## 2016-01-04 MED ORDER — LOSARTAN POTASSIUM 100 MG PO TABS: 100.0000 mg | ORAL_TABLET | Freq: Every day | ORAL | Status: DC

## 2016-01-04 NOTE — Telephone Encounter (Signed)
Pt returned Ana's call about lab results. Pt would like a call back on her work#. Thanks TNP

## 2016-01-04 NOTE — Telephone Encounter (Signed)
See lab note-aa 

## 2016-03-07 ENCOUNTER — Telehealth: Payer: Self-pay | Admitting: Emergency Medicine

## 2016-03-07 MED ORDER — CYCLOBENZAPRINE HCL 10 MG PO TABS: 10.0000 mg | ORAL_TABLET | Freq: Three times a day (TID) | ORAL | 0 refills | Status: DC | PRN

## 2016-03-07 NOTE — Telephone Encounter (Signed)
Pt husband (Dr. Juanetta GoslingHawkins) called stating pt needed a refill on her flexeril 10 mg she only takes it PRN. Please advise.   [facility] outpatient.

## 2016-03-11 ENCOUNTER — Other Ambulatory Visit: Payer: Self-pay

## 2016-03-11 NOTE — Telephone Encounter (Signed)
Fax from [facility] pharmacy for Flexeril refill-aa

## 2016-03-12 MED ORDER — CYCLOBENZAPRINE HCL 10 MG PO TABS: 10.0000 mg | ORAL_TABLET | Freq: Three times a day (TID) | ORAL | 3 refills | Status: DC | PRN

## 2016-04-02 ENCOUNTER — Ambulatory Visit: Payer: 59 | Admitting: Family Medicine

## 2016-04-09 ENCOUNTER — Ambulatory Visit (INDEPENDENT_AMBULATORY_CARE_PROVIDER_SITE_OTHER): Payer: 59 | Admitting: Family Medicine

## 2016-04-09 VITALS — BP 158/94 | HR 84 | Temp 98.0°F | Resp 14 | Wt 230.0 lb

## 2016-04-09 DIAGNOSIS — F419 Anxiety disorder, unspecified: Principal | ICD-10-CM

## 2016-04-09 DIAGNOSIS — F32A Depression, unspecified: Secondary | ICD-10-CM

## 2016-04-09 DIAGNOSIS — E784 Other hyperlipidemia: Secondary | ICD-10-CM | POA: Diagnosis not present

## 2016-04-09 DIAGNOSIS — Z23 Encounter for immunization: Secondary | ICD-10-CM

## 2016-04-09 DIAGNOSIS — E7849 Other hyperlipidemia: Secondary | ICD-10-CM

## 2016-04-09 DIAGNOSIS — F418 Other specified anxiety disorders: Secondary | ICD-10-CM

## 2016-04-09 DIAGNOSIS — I10 Essential (primary) hypertension: Secondary | ICD-10-CM

## 2016-04-09 DIAGNOSIS — F329 Major depressive disorder, single episode, unspecified: Secondary | ICD-10-CM

## 2016-04-09 DIAGNOSIS — G4709 Other insomnia: Secondary | ICD-10-CM

## 2016-04-09 MED ORDER — TEMAZEPAM 30 MG PO CAPS: 30.0000 mg | ORAL_CAPSULE | Freq: Every day | ORAL | 5 refills | Status: DC

## 2016-04-09 MED ORDER — LOSARTAN POTASSIUM-HCTZ 50-12.5 MG PO TABS: 1.0000 | ORAL_TABLET | Freq: Every day | ORAL | 3 refills | Status: DC

## 2016-04-09 MED ORDER — ALPRAZOLAM 0.5 MG PO TABS: 0.5000 mg | ORAL_TABLET | Freq: Two times a day (BID) | ORAL | 5 refills | Status: DC | PRN

## 2016-04-09 NOTE — Progress Notes (Signed)
Danielle GroomKathy L Walsh  MRN: [id number] DOB: 07/09/1955  Subjective:  HPI  Patient is here for follow up.  Hypertension: Changed Hyzaar to Losartan on 01/04/16 due to kidney function. Patient has  Not been checking  Her b/p. She has noticed some dizziness and is not sure if it is related to a different medication. BP Readings from Last 3 Encounters:  04/09/16 (!) 158/94  01/03/16 140/88  12/07/15 100/82   Patient still feels well on current dose of Cymbalta. Excited about 2nd grandchild on the way. Patient Active Problem List   Diagnosis Date Noted  . Anxiety and depression 03/29/2015  . Essential (primary) hypertension 03/29/2015  . Gastro-esophageal reflux disease without esophagitis 03/29/2015  . HLD (hyperlipidemia) 03/29/2015  . 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 Cannot sleep 03/29/2015  . Headache, migraine 03/29/2015    No past medical history on file.  Social History   Social History  . Marital status: Married    Spouse name: N/A  . Number of children: N/A  . Years of education: N/A   Occupational History  . Not on file.   Social History Main Topics  . Smoking status: Never Smoker  . Smokeless tobacco: Never Used  . Alcohol use Not on file  . Drug use: Unknown  . Sexual activity: Not on file   Other Topics Concern  . Not on file   Social History Narrative  . No narrative on file    Outpatient Encounter Prescriptions as of 04/09/2016  Medication Sig Note  . acetaminophen (TYLENOL) 500 MG tablet Take 1 tablet (500 mg total) by mouth every 6 (six) hours as needed.   . ALPRAZolam (XANAX) 0.5 MG tablet Take 1 tablet (0.5 mg total) by mouth 2 (two) times daily as needed for anxiety.   Marland Kitchen. atorvastatin (LIPITOR) 20 MG tablet Take 1 tablet (20 mg total) by mouth daily at 6 PM.   . cyclobenzaprine (FLEXERIL) 10 MG tablet Take 1 tablet (10 mg total) by mouth 3 (three) times daily as needed for muscle spasms.   . DULoxetine (CYMBALTA) 60 MG capsule Take 1 capsule (60 mg  total) by mouth daily.   . hyoscyamine (LEVBID) 0.375 MG 12 hr tablet Take by mouth. 03/29/2015: Medication taken as needed. GENERIC Received from: [facility]  . ibuprofen (ADVIL) 200 MG tablet Take 1 tablet (200 mg total) by mouth every 6 (six) hours as needed.   Marland Kitchen. losartan (COZAAR) 100 MG tablet Take 1 tablet (100 mg total) by mouth daily.   . meclizine (ANTIVERT) 25 MG tablet Take by mouth. 03/29/2015: Received from: [facility]  . Omega-3 Fatty Acids (FISH OIL) 1200 MG CAPS Take by mouth. 03/29/2015: Received from: [facility]  . Omeprazole 20 MG TBEC Take 1 tablet (20 mg total) by mouth daily.   . temazepam (RESTORIL) 30 MG capsule Take 1 capsule (30 mg total) by mouth at bedtime.    No facility-administered encounter medications on file as of 04/09/2016.     Allergies  Allergen Reactions  . Hydrocodone-Acetaminophen   . Penicillins     Review of Systems  Constitutional: Negative.   Respiratory: Negative.   Cardiovascular: Negative.   Musculoskeletal: Positive for joint pain (no more than usual).  Neurological: Positive for dizziness (at times).   Objective:  BP (!) 158/94   Pulse 84   Temp 98 F (36.7 C)   Resp 14   Wt 230 lb (104.3 kg)   Physical Exam  Constitutional: She is oriented  to person, place, and time and well-developed, well-nourished, and in no distress.  HENT:  Head: Normocephalic and atraumatic.  Eyes: Conjunctivae are normal. Pupils are equal, round, and reactive to light.  Neck: Normal range of motion. Neck supple.  Cardiovascular: Normal rate, regular rhythm, normal heart sounds and intact distal pulses.   Pulmonary/Chest: Effort normal and breath sounds normal. No respiratory distress. She has no wheezes.  Neurological: She is alert and oriented to person, place, and time.  Psychiatric: Mood, memory, affect and judgment normal.    Assessment and Plan :  1. Anxiety and depression Stable. Refill  given. - ALPRAZolam (XANAX) 0.5 MG tablet; Take 1 tablet (0.5 mg total) by mouth 2 (two) times daily as needed for anxiety.  Dispense: 60 tablet; Refill: 5 - temazepam (RESTORIL) 30 MG capsule; Take 1 capsule (30 mg total) by mouth at bedtime.  Dispense: 30 capsule; Refill: 5  2. Essential (primary) hypertension B/P not well controlled, switch back from Losartan to Hyzaar. Re check Renal panel today and on next f/u visit. Follow up 2 to 3 months. - Renal function panel - losartan-hydrochlorothiazide (HYZAAR) 50-12.5 MG tablet; Take 1 tablet by mouth daily.  Dispense: 90 tablet; Refill: 3  3. Other insomnia Stable. 4. Other hyperlipidemia  5. Need for influenza vaccination - Flu Vaccine QUAD 36+ mos PF IM (Fluarix & Fluzone Quad PF)  6. Need for Tdap vaccination - Tdap vaccine greater than or equal to 7yo IM  HPI, Exam and A&P transcribed under direction and in the presence of Julieanne Manson, MD. I have done the exam and reviewed the chart and it is accurate to the best of my knowledge. Julieanne Manson M.D. [facility]

## 2016-04-11 DIAGNOSIS — I10 Essential (primary) hypertension: Secondary | ICD-10-CM | POA: Diagnosis not present

## 2016-04-12 LAB — RENAL FUNCTION PANEL
ALBUMIN: 4.2 g/dL (ref 3.5–5.5)
BUN / CREAT RATIO: 12 (ref 9–23)
BUN: 13 mg/dL (ref 6–24)
CALCIUM: 9.2 mg/dL (ref 8.7–10.2)
CHLORIDE: 100 mmol/L (ref 96–106)
CO2: 26 mmol/L (ref 18–29)
CREATININE: 1.09 mg/dL — AB (ref 0.57–1.00)
GFR calc non Af Amer: 56 mL/min/{1.73_m2} — ABNORMAL LOW (ref >59)
GFR, EST AFRICAN AMERICAN: 64 mL/min/{1.73_m2} (ref >59)
Glucose: 91 mg/dL (ref 65–99)
Phosphorus: 3.2 mg/dL (ref 2.5–4.5)
Potassium: 3.7 mmol/L (ref 3.5–5.2)
Sodium: 141 mmol/L (ref 134–144)

## 2016-07-22 ENCOUNTER — Other Ambulatory Visit: Payer: Self-pay | Admitting: Family Medicine

## 2016-07-22 DIAGNOSIS — I10 Essential (primary) hypertension: Secondary | ICD-10-CM

## 2016-07-22 MED ORDER — LOSARTAN POTASSIUM-HCTZ 50-12.5 MG PO TABS: 1.0000 | ORAL_TABLET | Freq: Every day | ORAL | 3 refills | Status: DC

## 2016-07-22 NOTE — Telephone Encounter (Signed)
rx refilled.-aa 

## 2016-07-22 NOTE — Telephone Encounter (Signed)
Pt contacted office for refill request on the following medications: losartan-hydrochlorothiazide (HYZAAR) 50-12.5 MG tablet [facility] (Pt stated they had to switch pharmacy) Please advise. Thanks TNP

## 2016-07-22 NOTE — Telephone Encounter (Signed)
Please review. Pharmacy was updated. Thanks!  

## 2016-08-13 ENCOUNTER — Ambulatory Visit: Payer: 59 | Admitting: Family Medicine

## 2016-09-17 ENCOUNTER — Ambulatory Visit (INDEPENDENT_AMBULATORY_CARE_PROVIDER_SITE_OTHER): Payer: 59 | Admitting: Family Medicine

## 2016-09-17 ENCOUNTER — Encounter: Payer: Self-pay | Admitting: Family Medicine

## 2016-09-17 VITALS — BP 124/84 | HR 68 | Temp 98.2°F | Resp 16 | Ht 66.0 in | Wt 230.0 lb

## 2016-09-17 DIAGNOSIS — F329 Major depressive disorder, single episode, unspecified: Secondary | ICD-10-CM

## 2016-09-17 DIAGNOSIS — K219 Gastro-esophageal reflux disease without esophagitis: Secondary | ICD-10-CM | POA: Diagnosis not present

## 2016-09-17 DIAGNOSIS — F418 Other specified anxiety disorders: Secondary | ICD-10-CM

## 2016-09-17 DIAGNOSIS — E7849 Other hyperlipidemia: Secondary | ICD-10-CM

## 2016-09-17 DIAGNOSIS — F32A Depression, unspecified: Secondary | ICD-10-CM

## 2016-09-17 DIAGNOSIS — G4709 Other insomnia: Secondary | ICD-10-CM

## 2016-09-17 DIAGNOSIS — F419 Anxiety disorder, unspecified: Secondary | ICD-10-CM

## 2016-09-17 DIAGNOSIS — I10 Essential (primary) hypertension: Secondary | ICD-10-CM | POA: Diagnosis not present

## 2016-09-17 DIAGNOSIS — M62838 Other muscle spasm: Secondary | ICD-10-CM | POA: Diagnosis not present

## 2016-09-17 DIAGNOSIS — E784 Other hyperlipidemia: Secondary | ICD-10-CM

## 2016-09-17 MED ORDER — LOSARTAN POTASSIUM-HCTZ 50-12.5 MG PO TABS: 1.0000 | ORAL_TABLET | Freq: Every day | ORAL | 3 refills | Status: DC

## 2016-09-17 MED ORDER — DULOXETINE HCL 60 MG PO CPEP: 60.0000 mg | ORAL_CAPSULE | Freq: Every day | ORAL | 3 refills | Status: DC

## 2016-09-17 MED ORDER — CYCLOBENZAPRINE HCL 10 MG PO TABS: 10.0000 mg | ORAL_TABLET | Freq: Three times a day (TID) | ORAL | 3 refills | Status: DC | PRN

## 2016-09-17 MED ORDER — TEMAZEPAM 30 MG PO CAPS: 30.0000 mg | ORAL_CAPSULE | Freq: Every day | ORAL | 5 refills | Status: DC

## 2016-09-17 MED ORDER — ALPRAZOLAM 0.5 MG PO TABS: 0.5000 mg | ORAL_TABLET | Freq: Two times a day (BID) | ORAL | 5 refills | Status: DC | PRN

## 2016-09-17 MED ORDER — ATORVASTATIN CALCIUM 20 MG PO TABS: 20.0000 mg | ORAL_TABLET | Freq: Every day | ORAL | 3 refills | Status: DC

## 2016-09-17 MED ORDER — OMEPRAZOLE 20 MG PO TBEC: 1.0000 | DELAYED_RELEASE_TABLET | Freq: Every day | ORAL | 3 refills | Status: DC

## 2016-09-17 NOTE — Progress Notes (Signed)
Patient: Danielle Walsh Female    DOB: 22-Feb-1956   61 y.o.   MRN: [id number] Visit Date: 09/17/2016  Today's Provider: Megan Mans, MD   Chief Complaint  Patient presents with  . Hypertension  . Neck Pain  . Anxiety  . Insomnia   Subjective:    HPI      Hypertension, follow-up:  BP Readings from Last 3 Encounters:  09/17/16 124/84  04/09/16 (!) 158/94  01/03/16 140/88    She was last seen for hypertension 5 months ago.  BP at that visit was 158/94. Management since that visit includes D/C Losartan and restarting Hyzaar. She reports excellent compliance with treatment. She is not having side effects.  She is exercising. 2-3 times per week. Walks on treadmill for 30-415 minutes. She is adherent to low salt diet.   Outside blood pressures are 110's-130's/80's. She is experiencing fatigue and lower extremity edema.  Patient denies chest pain, chest pressure/discomfort, claudication, dyspnea, exertional chest pressure/discomfort, irregular heart beat, near-syncope, orthopnea, palpitations and syncope.   Cardiovascular risk factors include dyslipidemia, hypertension and obesity (BMI >= 30 kg/m2).    Weight trend: stable Wt Readings from Last 3 Encounters:  09/17/16 230 lb (104.3 kg)  04/09/16 230 lb (104.3 kg)  01/03/16 225 lb (102.1 kg)    Current diet: in general, a "healthy" diet    ------------------------------------------------------------------------ Neck Pain Pt needs refills of Cyclobenzaprine. Pt reports good compliance with the treatment, and good sx control.  Anxiety Pt is taking Alprazolam 0.5 mg tablets PRN for this problem. Pt is changing pharmacies (because husband is retiring and they can no longer use [facility]). Pt needs refill of xanax. Pt reports good compliance and good sx control.  Insomnia Pt also needs Restoril refilled. Pt reports good compliance of treatment, and good sx control.  Allergies  Allergen  Reactions  . Hydrocodone-Acetaminophen   . Penicillins      Current Outpatient Prescriptions:  .  acetaminophen (TYLENOL) 500 MG tablet, Take 1 tablet (500 mg total) by mouth every 6 (six) hours as needed., Disp: 30 tablet, Rfl: 0 .  ALPRAZolam (XANAX) 0.5 MG tablet, Take 1 tablet (0.5 mg total) by mouth 2 (two) times daily as needed for anxiety., Disp: 60 tablet, Rfl: 5 .  atorvastatin (LIPITOR) 20 MG tablet, Take 1 tablet (20 mg total) by mouth daily at 6 PM., Disp: 90 tablet, Rfl: 3 .  cyclobenzaprine (FLEXERIL) 10 MG tablet, Take 1 tablet (10 mg total) by mouth 3 (three) times daily as needed for muscle spasms., Disp: 90 tablet, Rfl: 3 .  DULoxetine (CYMBALTA) 60 MG capsule, Take 1 capsule (60 mg total) by mouth daily., Disp: 90 capsule, Rfl: 3 .  hyoscyamine (LEVBID) 0.375 MG 12 hr tablet, Take by mouth., Disp: , Rfl:  .  ibuprofen (ADVIL) 200 MG tablet, Take 1 tablet (200 mg total) by mouth every 6 (six) hours as needed., Disp: 30 tablet, Rfl: 0 .  losartan-hydrochlorothiazide (HYZAAR) 50-12.5 MG tablet, Take 1 tablet by mouth daily., Disp: 90 tablet, Rfl: 3 .  meclizine (ANTIVERT) 25 MG tablet, Take by mouth., Disp: , Rfl:  .  Omeprazole 20 MG TBEC, Take 1 tablet (20 mg total) by mouth daily., Disp: 90 each, Rfl: 3 .  temazepam (RESTORIL) 30 MG capsule, Take 1 capsule (30 mg total) by mouth at bedtime., Disp: 30 capsule, Rfl: 5  Review of Systems  Constitutional: Positive for fatigue. Negative for activity change, appetite change, chills,  diaphoresis, fever and unexpected weight change.  HENT: Negative.   Respiratory: Negative for shortness of breath.   Cardiovascular: Positive for leg swelling. Negative for chest pain and palpitations.  Endocrine: Negative.   Musculoskeletal: Negative.   Allergic/Immunologic: Negative.   Neurological: Negative.     Social History  Substance Use Topics  . Smoking status: Never Smoker  . Smokeless tobacco: Never Used  . Alcohol use 0.0 oz/week      Comment: occasionally   Objective:   BP 124/84 (BP Location: Right Arm, Patient Position: Sitting, Cuff Size: Large)   Pulse 68   Temp 98.2 F (36.8 C) (Oral)   Resp 16   Ht 5\' 6"  (1.676 m)   Wt 230 lb (104.3 kg)   BMI 37.12 kg/m  Vitals:   09/17/16 1613  BP: 124/84  Pulse: 68  Resp: 16  Temp: 98.2 F (36.8 C)  TempSrc: Oral  Weight: 230 lb (104.3 kg)  Height: 5\' 6"  (1.676 m)     Physical Exam  Constitutional: She is oriented to person, place, and time. She appears well-developed and well-nourished.  HENT:  Head: Normocephalic and atraumatic.  Right Ear: External ear normal.  Left Ear: External ear normal.  Nose: Nose normal.  Eyes: Conjunctivae are normal. No scleral icterus.  Neck: Normal range of motion. No thyromegaly present.  Cardiovascular: Normal rate, regular rhythm and normal heart sounds.   Pulmonary/Chest: Effort normal and breath sounds normal. No respiratory distress.  Abdominal: Soft.  Musculoskeletal: She exhibits no edema.  Neurological: She is alert and oriented to person, place, and time.  Skin: Skin is warm and dry.  Psychiatric: She has a normal mood and affect. Her behavior is normal. Judgment and thought content normal.        Assessment & Plan:     1. Essential (primary) hypertension Stable. Will recheck renal function. Refills ordered. - losartan-hydrochlorothiazide (HYZAAR) 50-12.5 MG tablet; Take 1 tablet by mouth daily.  Dispense: 90 tablet; Refill: 3 - Renal function panel  2. Other hyperlipidemia Stable. Refills ordered. - atorvastatin (LIPITOR) 20 MG tablet; Take 1 tablet (20 mg total) by mouth daily at 6 PM.  Dispense: 90 tablet; Refill: 3  3. Anxiety and depression Stable. Refills provided. - ALPRAZolam (XANAX) 0.5 MG tablet; Take 1 tablet (0.5 mg total) by mouth 2 (two) times daily as needed for anxiety.  Dispense: 60 tablet; Refill: 5 - DULoxetine (CYMBALTA) 60 MG capsule; Take 1 capsule (60 mg total) by mouth daily.   Dispense: 90 capsule; Refill: 3  4. Other insomnia Stable. Refills provided. - temazepam (RESTORIL) 30 MG capsule; Take 1 capsule (30 mg total) by mouth at bedtime.  Dispense: 30 capsule; Refill: 5   5. Gastro-esophageal reflux disease without esophagitis Stable. Refills provided. - Omeprazole 20 MG TBEC; Take 1 tablet (20 mg total) by mouth daily.  Dispense: 90 each; Refill: 3  6. Muscle spasm Stable. Refills provided. - cyclobenzaprine (FLEXERIL) 10 MG tablet; Take 1 tablet (10 mg total) by mouth 3 (three) times daily as needed for muscle spasms.  Dispense: 90 tablet; Refill: 3  7.Postmenopausal Refer to Dr Valentino Saxonherry.    Patient seen and examined by Julieanne Mansonichard Gilbert, MD, and note scribed by Allene DillonEmily Drozdowski, CMA. I have done the exam and reviewed the above chart and it is accurate to the best of my knowledge. Dentist[employer]agon  technology has been used in this note in any air is in the dictation or transcription are unintentional.  Megan Mansichard Gilbert Jr, MD  [facility]

## 2017-03-23 ENCOUNTER — Ambulatory Visit: Payer: 59 | Admitting: Family Medicine

## 2017-04-06 ENCOUNTER — Encounter: Payer: Self-pay | Admitting: Unknown Physician Specialty

## 2017-04-13 ENCOUNTER — Ambulatory Visit (INDEPENDENT_AMBULATORY_CARE_PROVIDER_SITE_OTHER): Payer: 59 | Admitting: Family Medicine

## 2017-04-13 VITALS — BP 122/82 | HR 87 | Temp 97.3°F | Resp 16 | Wt 231.0 lb

## 2017-04-13 DIAGNOSIS — Z23 Encounter for immunization: Secondary | ICD-10-CM | POA: Diagnosis not present

## 2017-04-13 DIAGNOSIS — I10 Essential (primary) hypertension: Secondary | ICD-10-CM

## 2017-04-13 DIAGNOSIS — Z124 Encounter for screening for malignant neoplasm of cervix: Secondary | ICD-10-CM | POA: Diagnosis not present

## 2017-04-13 DIAGNOSIS — E7849 Other hyperlipidemia: Secondary | ICD-10-CM | POA: Diagnosis not present

## 2017-04-13 NOTE — Progress Notes (Signed)
Danielle Walsh  MRN: [id number] DOB: 18-Feb-1956  Subjective:  HPI   The patient is a 61 year old female who presents for 6 month follow up of chronic disease.  She was last seen on 09/17/16.  Her last routine labs were done on 11/02/15.  Hypertension- The patient did not have her renal function after the last visit due hectic activities with family.  She has had another grandchild since that last visit.    BP Readings from Last 3 Encounters:  04/13/17 122/82  09/17/16 124/84  04/09/16 (!) 158/94    Anxiety/Depression The patient states she is doing better with the stress with her husband.  Her work is more stressful at this point.    Depression screen [facility] 2/9 04/13/2017 10/25/2015  Decreased Interest 0 3  Down, Depressed, Hopeless 0 1  PHQ - 2 Score 0 4  Altered sleeping 1 2  Tired, decreased energy 1 3  Change in appetite 0 3  Feeling bad or failure about yourself  0 3  Trouble concentrating 0 2  Moving slowly or fidgety/restless 0 2  Suicidal thoughts 0 0  PHQ-9 Score 2 19  Difficult doing work/chores Not difficult at all Very difficult    GERD- Controlled as long as she takes the medicine.  Patient states she is not able to come off of the Omeprazole.  Insomnia- patient is doing ok with this.  She is only taking a sleep aid about 2 months per week.  She is able to fall asleep but not able to go back to sleep if she wakes up.      Patient Active Problem List   Diagnosis Date Noted  . Anxiety and depression 03/29/2015  . Essential (primary) hypertension 03/29/2015  . Gastro-esophageal reflux disease without esophagitis 03/29/2015  . HLD (hyperlipidemia) 03/29/2015  . 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 Cannot sleep 03/29/2015  . Headache, migraine 03/29/2015    No past medical history on file.  Social History   Social History  . Marital status: Married    Spouse name: N/A  . Number of children: N/A  . Years of education: N/A   Occupational History  . Not on  file.   Social History Main Topics  . Smoking status: Never Smoker  . Smokeless tobacco: Never Used  . Alcohol use 0.0 oz/week     Comment: occasionally  . Drug use: Unknown  . Sexual activity: Not on file   Other Topics Concern  . Not on file   Social History Narrative  . No narrative on file    Outpatient Encounter Prescriptions as of 04/13/2017  Medication Sig Note  . acetaminophen (TYLENOL) 500 MG tablet Take 1 tablet (500 mg total) by mouth every 6 (six) hours as needed.   . ALPRAZolam (XANAX) 0.5 MG tablet Take 1 tablet (0.5 mg total) by mouth 2 (two) times daily as needed for anxiety.   Marland Kitchen atorvastatin (LIPITOR) 20 MG tablet Take 1 tablet (20 mg total) by mouth daily at 6 PM.   . cyclobenzaprine (FLEXERIL) 10 MG tablet Take 1 tablet (10 mg total) by mouth 3 (three) times daily as needed for muscle spasms.   . DULoxetine (CYMBALTA) 60 MG capsule Take 1 capsule (60 mg total) by mouth daily.   . hyoscyamine (LEVBID) 0.375 MG 12 hr tablet Take by mouth. 03/29/2015: Medication taken as needed. GENERIC Received from: [facility]  . ibuprofen (ADVIL) 200 MG tablet Take 1 tablet (200 mg total) by mouth every  6 (six) hours as needed.   Marland Kitchen losartan-hydrochlorothiazide (HYZAAR) 50-12.5 MG tablet Take 1 tablet by mouth daily.   . meclizine (ANTIVERT) 25 MG tablet Take by mouth. 03/29/2015: Received from: [facility]  . Omeprazole 20 MG TBEC Take 1 tablet (20 mg total) by mouth daily.   . temazepam (RESTORIL) 30 MG capsule Take 1 capsule (30 mg total) by mouth at bedtime.    No facility-administered encounter medications on file as of 04/13/2017.     Allergies  Allergen Reactions  . Hydrocodone-Acetaminophen   . Penicillins     Review of Systems  Constitutional: Negative for fever. Malaise/fatigue: chronic   Respiratory: Negative for cough, shortness of breath and wheezing.   Cardiovascular: Negative for chest pain, palpitations, orthopnea,  claudication and leg swelling.  Neurological: Negative for weakness.  Psychiatric/Behavioral: Negative for depression, hallucinations, memory loss, substance abuse and suicidal ideas. The patient is nervous/anxious and has insomnia.     Objective:  BP 122/82 (BP Location: Right Arm, Patient Position: Sitting, Cuff Size: Normal)   Pulse 87   Temp (!) 97.3 F (36.3 C) (Oral)   Resp 16   Wt 231 lb (104.8 kg)   BMI 37.28 kg/m   Physical Exam  Constitutional: She is well-developed, well-nourished, and in no distress.  HENT:  Head: Normocephalic and atraumatic.  Eyes: Pupils are equal, round, and reactive to light.  Neck: Normal range of motion.  Cardiovascular: Normal rate, regular rhythm and normal heart sounds.   Pulmonary/Chest: Effort normal and breath sounds normal.  Psychiatric: Affect normal.    Assessment and Plan :  1. Essential (primary) hypertension  - CBC with Differential/Platelet - Comprehensive metabolic panel - TSH  2. Other hyperlipidemia  - Lipid panel  3. Need for influenza vaccination  - Flu Vaccine QUAD 6+ mos PF IM (Fluarix Quad PF)  4. Cervical cancer screening  - Ambulatory referral to Obstetrics / Gynecology  I have done the exam and reviewed the chart and it is accurate to the best of my knowledge. Dentist has been used and  any errors in dictation or transcription are unintentional. Julieanne Manson M.D. [facility]

## 2017-04-23 ENCOUNTER — Telehealth: Payer: Self-pay | Admitting: Family Medicine

## 2017-04-23 LAB — CBC WITH DIFFERENTIAL/PLATELET
BASOS ABS: 103 {cells}/uL (ref 0–200)
Basophils Relative: 1.1 %
EOS PCT: 1.7 %
Eosinophils Absolute: 160 cells/uL (ref 15–500)
HCT: 40 % (ref 35.0–45.0)
Hemoglobin: 13.4 g/dL (ref 11.7–15.5)
Lymphs Abs: 2867 cells/uL (ref 850–3900)
MCH: 30.3 pg (ref 27.0–33.0)
MCHC: 33.5 g/dL (ref 32.0–36.0)
MCV: 90.5 fL (ref 80.0–100.0)
MONOS PCT: 6.4 %
MPV: 11.5 fL (ref 7.5–12.5)
Neutro Abs: 5668 cells/uL (ref 1500–7800)
Neutrophils Relative %: 60.3 %
Platelets: 200 10*3/uL (ref 140–400)
RBC: 4.42 10*6/uL (ref 3.80–5.10)
RDW: 11.7 % (ref 11.0–15.0)
Total Lymphocyte: 30.5 %
WBC mixed population: 602 cells/uL (ref 200–950)
WBC: 9.4 10*3/uL (ref 3.8–10.8)

## 2017-04-23 LAB — COMPLETE METABOLIC PANEL WITH GFR
AG RATIO: 1.7 (calc) (ref 1.0–2.5)
ALBUMIN MSPROF: 4.8 g/dL (ref 3.6–5.1)
ALKALINE PHOSPHATASE (APISO): 109 U/L (ref 33–130)
ALT: 14 U/L (ref 6–29)
AST: 19 U/L (ref 10–35)
BILIRUBIN TOTAL: 0.7 mg/dL (ref 0.2–1.2)
BUN/Creatinine Ratio: 17 (calc) (ref 6–22)
BUN: 17 mg/dL (ref 7–25)
CALCIUM: 9.7 mg/dL (ref 8.6–10.4)
CHLORIDE: 103 mmol/L (ref 98–110)
CO2: 27 mmol/L (ref 20–32)
Creat: 1.02 mg/dL — ABNORMAL HIGH (ref 0.50–0.99)
GFR, EST NON AFRICAN AMERICAN: 60 mL/min/{1.73_m2} (ref >=60)
GFR, Est African American: 69 mL/min/{1.73_m2} (ref >=60)
Globulin: 2.8 g/dL (calc) (ref 1.9–3.7)
Glucose, Bld: 103 mg/dL — ABNORMAL HIGH (ref 65–99)
POTASSIUM: 3.7 mmol/L (ref 3.5–5.3)
SODIUM: 141 mmol/L (ref 135–146)
TOTAL PROTEIN: 7.6 g/dL (ref 6.1–8.1)

## 2017-04-23 LAB — LIPID PANEL
CHOLESTEROL: 161 mg/dL (ref <200)
HDL: 63 mg/dL (ref >50)
LDL CHOLESTEROL (CALC): 81 mg/dL
Non-HDL Cholesterol (Calc): 98 mg/dL (calc) (ref <130)
Total CHOL/HDL Ratio: 2.6 (calc) (ref <5.0)
Triglycerides: 83 mg/dL (ref <150)

## 2017-04-23 LAB — TSH: TSH: 1.46 m[IU]/L (ref 0.40–4.50)

## 2017-04-23 NOTE — Telephone Encounter (Signed)
ROI ([facility]) faxed to Dr. Kerrin MoBob Elliott [facility]( [place]) for records.

## 2017-04-27 ENCOUNTER — Encounter: Payer: Self-pay | Admitting: Family Medicine

## 2017-04-27 ENCOUNTER — Other Ambulatory Visit: Payer: Self-pay

## 2017-04-27 ENCOUNTER — Telehealth: Payer: Self-pay

## 2017-04-27 DIAGNOSIS — I10 Essential (primary) hypertension: Secondary | ICD-10-CM

## 2017-04-27 DIAGNOSIS — K219 Gastro-esophageal reflux disease without esophagitis: Secondary | ICD-10-CM

## 2017-04-27 DIAGNOSIS — F419 Anxiety disorder, unspecified: Principal | ICD-10-CM

## 2017-04-27 DIAGNOSIS — F329 Major depressive disorder, single episode, unspecified: Secondary | ICD-10-CM

## 2017-04-27 DIAGNOSIS — F32A Depression, unspecified: Secondary | ICD-10-CM

## 2017-04-27 MED ORDER — OMEPRAZOLE 20 MG PO TBEC: 1.0000 | DELAYED_RELEASE_TABLET | Freq: Every day | ORAL | 3 refills | Status: DC

## 2017-04-27 MED ORDER — LOSARTAN POTASSIUM-HCTZ 50-12.5 MG PO TABS: 1.0000 | ORAL_TABLET | Freq: Every day | ORAL | 3 refills | Status: DC

## 2017-04-27 MED ORDER — DULOXETINE HCL 60 MG PO CPEP: 60.0000 mg | ORAL_CAPSULE | Freq: Every day | ORAL | 3 refills | Status: DC

## 2017-04-27 NOTE — Telephone Encounter (Signed)
I forgot to get you to refill my scripts when I was in on 10/8. I need refills for Cymbalta, Hyzaar, Omeprazole.  I use [facility] in [place]. I also need just in case refills for Xanax and Temazepam. Still have some left from months ago but if I need more I'll need current scripts. I know I'll need to come by and pick up a paper script for those. Left me know when they are ready and I'll come by to get them.    Thanks   Came through [facility]-Anastasiya V Hopkins, RMA

## 2017-04-27 NOTE — Telephone Encounter (Signed)
Pt is returning call.  [phone] or [phone]

## 2017-04-27 NOTE — Telephone Encounter (Signed)
-----   Message from Maple Hudsonichard L Gilbert Jr., MD sent at 04/27/2017  8:30 AM EDT ----- All ok.

## 2017-04-27 NOTE — Telephone Encounter (Signed)
LMTCB 04/27/2017  Thanks,   -Nira Visscher  

## 2017-04-28 MED ORDER — TEMAZEPAM 30 MG PO CAPS: 30.0000 mg | ORAL_CAPSULE | Freq: Every day | ORAL | 5 refills | Status: DC

## 2017-04-28 MED ORDER — ALPRAZOLAM 0.5 MG PO TABS: 0.5000 mg | ORAL_TABLET | Freq: Two times a day (BID) | ORAL | 5 refills | Status: DC | PRN

## 2017-04-29 NOTE — Telephone Encounter (Signed)
Pt advised-Ledford Goodson V Shasha Buchbinder, RMA  

## 2017-05-06 ENCOUNTER — Encounter: Payer: 59 | Admitting: Obstetrics and Gynecology

## 2017-06-16 ENCOUNTER — Encounter: Payer: 59 | Admitting: Obstetrics and Gynecology

## 2017-06-24 ENCOUNTER — Telehealth: Payer: Self-pay | Admitting: Family Medicine

## 2017-06-24 NOTE — Telephone Encounter (Signed)
FYI--Pt was a no show for cervical cancer screening with Dr Valentino Saxonherry

## 2017-08-26 ENCOUNTER — Telehealth: Payer: Self-pay | Admitting: Family Medicine

## 2017-08-26 DIAGNOSIS — T753XXS Motion sickness, sequela: Secondary | ICD-10-CM

## 2017-08-26 MED ORDER — SCOPOLAMINE 1 MG/3DAYS TD PT72: 1.0000 | MEDICATED_PATCH | TRANSDERMAL | 12 refills | Status: DC

## 2017-08-26 NOTE — Telephone Encounter (Signed)
Ok

## 2017-08-26 NOTE — Telephone Encounter (Signed)
please advise.

## 2017-08-26 NOTE — Telephone Encounter (Signed)
Sent pt. Informed.

## 2017-08-26 NOTE — Telephone Encounter (Signed)
Patient is going on a cruise Saturday and needs Scopolamine patches called in today or tomorrow please.  Wants 3 please.  [facility] [place]

## 2017-09-15 ENCOUNTER — Encounter: Payer: Self-pay | Admitting: Physician Assistant

## 2017-09-15 ENCOUNTER — Ambulatory Visit: Payer: 59 | Admitting: Physician Assistant

## 2017-09-15 VITALS — BP 158/90 | HR 90 | Temp 98.5°F | Resp 16 | Wt 225.0 lb

## 2017-09-15 DIAGNOSIS — R05 Cough: Secondary | ICD-10-CM | POA: Diagnosis not present

## 2017-09-15 DIAGNOSIS — J0140 Acute pansinusitis, unspecified: Secondary | ICD-10-CM | POA: Diagnosis not present

## 2017-09-15 DIAGNOSIS — R059 Cough, unspecified: Secondary | ICD-10-CM

## 2017-09-15 DIAGNOSIS — J040 Acute laryngitis: Secondary | ICD-10-CM

## 2017-09-15 MED ORDER — PREDNISONE 10 MG (21) PO TBPK: ORAL_TABLET | ORAL | 0 refills | Status: DC

## 2017-09-15 MED ORDER — BENZONATATE 100 MG PO CAPS: 100.0000 mg | ORAL_CAPSULE | Freq: Two times a day (BID) | ORAL | 0 refills | Status: DC | PRN

## 2017-09-15 MED ORDER — AZITHROMYCIN 250 MG PO TABS: ORAL_TABLET | ORAL | 0 refills | Status: DC

## 2017-09-15 NOTE — Progress Notes (Signed)
Patient: Danielle Walsh Female    DOB: 10/27/1955   62 y.o.   MRN: [id number] Visit Date: 09/15/2017  Today's Provider: Margaretann LovelessJennifer M Louvinia Cumbo, PA-C   Chief Complaint  Patient presents with  . URI   Subjective:    URI   This is a new problem. The current episode started 1 to 4 weeks ago (Started to worsening yesterday). The problem has been gradually worsening. There has been no fever. Associated symptoms include congestion, coughing, ear pain (left side of her ear), headaches, sinus pain (frontal area), a sore throat and wheezing ("a little"). Pertinent negatives include no abdominal pain, chest pain, diarrhea or vomiting. Associated symptoms comments: "body ache last week". She has tried acetaminophen, decongestant, sleep and antihistamine (Mucinex-D,Mucinex DM and Sudafed) for the symptoms. The treatment provided no relief.      Allergies  Allergen Reactions  . Hydrocodone-Acetaminophen   . Penicillins      Current Outpatient Medications:  .  acetaminophen (TYLENOL) 500 MG tablet, Take 1 tablet (500 mg total) by mouth every 6 (six) hours as needed., Disp: 30 tablet, Rfl: 0 .  ALPRAZolam (XANAX) 0.5 MG tablet, Take 1 tablet (0.5 mg total) by mouth 2 (two) times daily as needed for anxiety., Disp: 60 tablet, Rfl: 5 .  atorvastatin (LIPITOR) 20 MG tablet, Take 1 tablet (20 mg total) by mouth daily at 6 PM., Disp: 90 tablet, Rfl: 3 .  cyclobenzaprine (FLEXERIL) 10 MG tablet, Take 1 tablet (10 mg total) by mouth 3 (three) times daily as needed for muscle spasms., Disp: 90 tablet, Rfl: 3 .  DULoxetine (CYMBALTA) 60 MG capsule, Take 1 capsule (60 mg total) by mouth daily., Disp: 90 capsule, Rfl: 3 .  hyoscyamine (LEVBID) 0.375 MG 12 hr tablet, Take by mouth., Disp: , Rfl:  .  losartan-hydrochlorothiazide (HYZAAR) 50-12.5 MG tablet, Take 1 tablet by mouth daily., Disp: 90 tablet, Rfl: 3 .  meclizine (ANTIVERT) 25 MG tablet, Take by mouth., Disp: , Rfl:  .  Omeprazole 20 MG TBEC, Take  1 tablet (20 mg total) by mouth daily., Disp: 90 each, Rfl: 3 .  temazepam (RESTORIL) 30 MG capsule, Take 1 capsule (30 mg total) by mouth at bedtime., Disp: 30 capsule, Rfl: 5  Review of Systems  Constitutional: Negative for chills and fever.  HENT: Positive for congestion, ear pain (left side of her ear), postnasal drip, sinus pressure, sinus pain (frontal area), sore throat, trouble swallowing and voice change.   Eyes: Negative.   Respiratory: Positive for cough, chest tightness and wheezing ("a little"). Negative for shortness of breath.   Cardiovascular: Negative for chest pain.  Gastrointestinal: Negative for abdominal pain, diarrhea and vomiting.  Neurological: Positive for dizziness ("a little") and headaches.    Social History   Tobacco Use  . Smoking status: Never Smoker  . Smokeless tobacco: Never Used  Substance Use Topics  . Alcohol use: Yes    Alcohol/week: 0.0 oz    Comment: occasionally   Objective:   BP (!) 158/90 (BP Location: Left Arm, Patient Position: Sitting, Cuff Size: Large)   Pulse 90   Temp 98.5 F (36.9 C) (Oral)   Resp 16   Wt 225 lb (102.1 kg)   SpO2 98%   BMI 36.32 kg/m    Physical Exam  Constitutional: She appears well-developed and well-nourished. No distress.  HENT:  Head: Normocephalic and atraumatic.  Right Ear: Hearing, tympanic membrane, external ear and ear canal normal.  Left Ear: Hearing,  tympanic membrane, external ear and ear canal normal.  Nose: Mucosal edema present. No rhinorrhea. Right sinus exhibits maxillary sinus tenderness and frontal sinus tenderness. Left sinus exhibits maxillary sinus tenderness and frontal sinus tenderness.  Mouth/Throat: Uvula is midline, oropharynx is clear and moist and mucous membranes are normal. No oropharyngeal exudate, posterior oropharyngeal edema or posterior oropharyngeal erythema.  laryngitis  Neck: Normal range of motion. Neck supple. No tracheal deviation present. No thyromegaly present.    Cardiovascular: Normal rate, regular rhythm and normal heart sounds. Exam reveals no gallop and no friction rub.  No murmur heard. Pulmonary/Chest: Effort normal and breath sounds normal. No stridor. No respiratory distress. She has no wheezes. She has no rales.  Lymphadenopathy:    She has no cervical adenopathy.  Skin: She is not diaphoretic.  Vitals reviewed.       Assessment & Plan:     1. Acute pansinusitis, recurrence not specified Worsening symptoms that have not responded to OTC medications. Will give Zpak and prednisone as below. Continue allergy medications. Stay well hydrated and get plenty of rest. Call if no symptom improvement or if symptoms worsen. - azithromycin (ZITHROMAX) 250 MG tablet; Take 2 tablets PO on day one, and one tablet PO daily thereafter until completed.  Dispense: 6 tablet; Refill: 0 - predniSONE (STERAPRED UNI-PAK 21 TAB) 10 MG (21) TBPK tablet; 6 day taper; take as directed on package instructions  Dispense: 21 tablet; Refill: 0  2. Laryngitis Voice rest. Salt water gargles.   3. Cough Tessalon perles for cough prn. - predniSONE (STERAPRED UNI-PAK 21 TAB) 10 MG (21) TBPK tablet; 6 day taper; take as directed on package instructions  Dispense: 21 tablet; Refill: 0 - benzonatate (TESSALON) 100 MG capsule; Take 1 capsule (100 mg total) by mouth 2 (two) times daily as needed for cough.  Dispense: 20 capsule; Refill: 0       Margaretann Loveless, PA-C  [facility]

## 2017-09-15 NOTE — Patient Instructions (Signed)

## 2017-10-01 ENCOUNTER — Other Ambulatory Visit: Payer: Self-pay | Admitting: Family Medicine

## 2017-10-01 DIAGNOSIS — K219 Gastro-esophageal reflux disease without esophagitis: Secondary | ICD-10-CM

## 2017-10-21 ENCOUNTER — Ambulatory Visit: Payer: 59 | Admitting: Family Medicine

## 2017-10-21 ENCOUNTER — Encounter: Payer: Self-pay | Admitting: Family Medicine

## 2017-10-21 DIAGNOSIS — F329 Major depressive disorder, single episode, unspecified: Secondary | ICD-10-CM | POA: Diagnosis not present

## 2017-10-21 DIAGNOSIS — M62838 Other muscle spasm: Secondary | ICD-10-CM

## 2017-10-21 DIAGNOSIS — E7849 Other hyperlipidemia: Secondary | ICD-10-CM | POA: Diagnosis not present

## 2017-10-21 DIAGNOSIS — F32A Depression, unspecified: Secondary | ICD-10-CM

## 2017-10-21 DIAGNOSIS — F419 Anxiety disorder, unspecified: Secondary | ICD-10-CM

## 2017-10-21 NOTE — Progress Notes (Signed)
Patient: Danielle Walsh Female    DOB: Nov 28, 1955   62 y.o.   MRN: [id number] Visit Date: 10/21/2017  Today's Provider: Megan Mans, MD   Chief Complaint  Patient presents with  . Hypertension  . Depression   Subjective:    HPI  Hypertension, follow-up:  BP Readings from Last 3 Encounters:  10/21/17 128/84  09/15/17 (!) 158/90  04/13/17 122/82    She was last seen for hypertension 6 months ago.  BP at that visit was 158/90. Management since that visit includes none. She reports good compliance with treatment. She is not having side effects.  She is exercising, 2 times a week walking on the treadmill but has started to have some knee pain. She is adherent to low salt diet.   Outside blood pressures are 130's/80's. Patient denies chest pain, chest pressure/discomfort, claudication, dyspnea, exertional chest pressure/discomfort, fatigue, irregular heart beat, lower extremity edema, near-syncope, orthopnea, palpitations, paroxysmal nocturnal dyspnea, syncope and tachypnea.   Wt Readings from Last 3 Encounters:  10/21/17 227 lb (103 kg)  09/15/17 225 lb (102.1 kg)  04/13/17 231 lb (104.8 kg)    ------------------------------------------------------------------------       Allergies  Allergen Reactions  . Hydrocodone-Acetaminophen   . Penicillins      Current Outpatient Medications:  .  acetaminophen (TYLENOL) 500 MG tablet, Take 1 tablet (500 mg total) by mouth every 6 (six) hours as needed., Disp: 30 tablet, Rfl: 0 .  ALPRAZolam (XANAX) 0.5 MG tablet, Take 1 tablet (0.5 mg total) by mouth 2 (two) times daily as needed for anxiety., Disp: 60 tablet, Rfl: 5 .  atorvastatin (LIPITOR) 20 MG tablet, Take 1 tablet (20 mg total) by mouth daily at 6 PM., Disp: 90 tablet, Rfl: 3 .  cyclobenzaprine (FLEXERIL) 10 MG tablet, Take 1 tablet (10 mg total) by mouth 3 (three) times daily as needed for muscle spasms., Disp: 90 tablet, Rfl: 3 .  DULoxetine (CYMBALTA)  60 MG capsule, Take 1 capsule (60 mg total) by mouth daily., Disp: 90 capsule, Rfl: 3 .  losartan-hydrochlorothiazide (HYZAAR) 50-12.5 MG tablet, Take 1 tablet by mouth daily., Disp: 90 tablet, Rfl: 3 .  omeprazole (PRILOSEC) 20 MG capsule, TAKE 1 CAPSULE(20 MG) BY MOUTH DAILY, Disp: 90 capsule, Rfl: 3 .  Omeprazole 20 MG TBEC, Take 1 tablet (20 mg total) by mouth daily., Disp: 90 each, Rfl: 3 .  temazepam (RESTORIL) 30 MG capsule, Take 1 capsule (30 mg total) by mouth at bedtime., Disp: 30 capsule, Rfl: 5 .  azithromycin (ZITHROMAX) 250 MG tablet, Take 2 tablets PO on day one, and one tablet PO daily thereafter until completed. (Patient not taking: Reported on 10/21/2017), Disp: 6 tablet, Rfl: 0 .  benzonatate (TESSALON) 100 MG capsule, Take 1 capsule (100 mg total) by mouth 2 (two) times daily as needed for cough. (Patient not taking: Reported on 10/21/2017), Disp: 20 capsule, Rfl: 0 .  hyoscyamine (LEVBID) 0.375 MG 12 hr tablet, Take by mouth., Disp: , Rfl:  .  meclizine (ANTIVERT) 25 MG tablet, Take by mouth., Disp: , Rfl:  .  predniSONE (STERAPRED UNI-PAK 21 TAB) 10 MG (21) TBPK tablet, 6 day taper; take as directed on package instructions (Patient not taking: Reported on 10/21/2017), Disp: 21 tablet, Rfl: 0  Review of Systems  Constitutional: Negative.   HENT: Negative.   Eyes: Negative.   Respiratory: Negative.   Cardiovascular: Negative.   Gastrointestinal: Negative.   Endocrine: Negative.   Genitourinary: Negative.  Musculoskeletal: Positive for arthralgias.  Skin: Negative.   Allergic/Immunologic: Negative.   Neurological: Negative.   Hematological: Negative.   Psychiatric/Behavioral: Negative.     Social History   Tobacco Use  . Smoking status: Never Smoker  . Smokeless tobacco: Never Used  Substance Use Topics  . Alcohol use: Yes    Alcohol/week: 0.0 oz    Comment: occasionally   Objective:   BP 128/84 (BP Location: Left Arm, Patient Position: Sitting, Cuff Size:  Large)   Pulse 86   Temp 97.6 F (36.4 C) (Oral)   Resp 16   Wt 227 lb (103 kg)   BMI 36.64 kg/m  Vitals:   10/21/17 1346  BP: 128/84  Pulse: 86  Resp: 16  Temp: 97.6 F (36.4 C)  TempSrc: Oral  Weight: 227 lb (103 kg)     Physical Exam  Constitutional: She is oriented to person, place, and time. She appears well-developed and well-nourished.  HENT:  Head: Normocephalic and atraumatic.  Neck: No thyromegaly present.  Cardiovascular: Normal rate, regular rhythm and normal heart sounds.  Pulmonary/Chest: Effort normal and breath sounds normal.  Abdominal: Soft.  Musculoskeletal: She exhibits no edema.  Lymphadenopathy:    She has no cervical adenopathy.  Neurological: She is alert and oriented to person, place, and time.  Skin: Skin is warm and dry.  Psychiatric: She has a normal mood and affect. Her behavior is normal. Judgment and thought content normal.        Assessment & Plan:     1. Anxiety and depression  - ALPRAZolam (XANAX) 0.5 MG tablet; Take 1 tablet (0.5 mg total) by mouth 2 (two) times daily as needed for anxiety.  Dispense: 60 tablet; Refill: 5 - temazepam (RESTORIL) 30 MG capsule; Take 1 capsule (30 mg total) by mouth at bedtime.  Dispense: 30 capsule; Refill: 5  2. Other hyperlipidemia  - atorvastatin (LIPITOR) 20 MG tablet; Take 1 tablet (20 mg total) by mouth daily at 6 PM.  Dispense: 90 tablet; Refill: 3  3. Muscle spasm  - cyclobenzaprine (FLEXERIL) 10 MG tablet; Take 1 tablet (10 mg total) by mouth 3 (three) times daily as needed for muscle spasms.  Dispense: 90 tablet; Refill: 3       Deliah Strehlow Wendelyn BreslowGilbert Jr, MD  [facility]

## 2017-10-24 MED ORDER — CYCLOBENZAPRINE HCL 10 MG PO TABS: 10.0000 mg | ORAL_TABLET | Freq: Three times a day (TID) | ORAL | 3 refills | Status: DC | PRN

## 2017-10-24 MED ORDER — ALPRAZOLAM 0.5 MG PO TABS: 0.5000 mg | ORAL_TABLET | Freq: Two times a day (BID) | ORAL | 5 refills | Status: DC | PRN

## 2017-10-24 MED ORDER — ATORVASTATIN CALCIUM 20 MG PO TABS: 20.0000 mg | ORAL_TABLET | Freq: Every day | ORAL | 3 refills | Status: DC

## 2017-10-24 MED ORDER — TEMAZEPAM 30 MG PO CAPS: 30.0000 mg | ORAL_CAPSULE | Freq: Every day | ORAL | 5 refills | Status: DC

## 2017-11-02 ENCOUNTER — Other Ambulatory Visit: Payer: Self-pay | Admitting: Family Medicine

## 2017-11-02 DIAGNOSIS — M62838 Other muscle spasm: Secondary | ICD-10-CM

## 2017-11-02 DIAGNOSIS — F329 Major depressive disorder, single episode, unspecified: Secondary | ICD-10-CM

## 2017-11-02 DIAGNOSIS — F419 Anxiety disorder, unspecified: Secondary | ICD-10-CM

## 2017-11-02 DIAGNOSIS — E7849 Other hyperlipidemia: Secondary | ICD-10-CM

## 2017-11-02 DIAGNOSIS — F32A Depression, unspecified: Secondary | ICD-10-CM

## 2017-11-02 NOTE — Telephone Encounter (Signed)
Pt wants all her prescriptions sent to [facility] in [place].  They were sent to [facility] and she no longer uses [facility].  Barth Kirks

## 2017-11-04 ENCOUNTER — Other Ambulatory Visit: Payer: Self-pay | Admitting: Family Medicine

## 2017-11-04 DIAGNOSIS — F329 Major depressive disorder, single episode, unspecified: Secondary | ICD-10-CM

## 2017-11-04 DIAGNOSIS — F419 Anxiety disorder, unspecified: Principal | ICD-10-CM

## 2017-11-04 DIAGNOSIS — F32A Depression, unspecified: Secondary | ICD-10-CM

## 2018-02-03 DIAGNOSIS — L570 Actinic keratosis: Secondary | ICD-10-CM | POA: Diagnosis not present

## 2018-02-03 DIAGNOSIS — L578 Other skin changes due to chronic exposure to nonionizing radiation: Secondary | ICD-10-CM | POA: Diagnosis not present

## 2018-02-03 DIAGNOSIS — L821 Other seborrheic keratosis: Secondary | ICD-10-CM | POA: Diagnosis not present

## 2018-02-03 DIAGNOSIS — L812 Freckles: Secondary | ICD-10-CM | POA: Diagnosis not present

## 2018-02-03 DIAGNOSIS — L820 Inflamed seborrheic keratosis: Secondary | ICD-10-CM | POA: Diagnosis not present

## 2018-03-05 ENCOUNTER — Other Ambulatory Visit: Payer: Self-pay | Admitting: Family Medicine

## 2018-03-05 DIAGNOSIS — E7849 Other hyperlipidemia: Secondary | ICD-10-CM

## 2018-03-05 NOTE — Telephone Encounter (Signed)
Pharmacy requesting refills. Thanks!  

## 2018-04-16 DIAGNOSIS — Z23 Encounter for immunization: Secondary | ICD-10-CM | POA: Diagnosis not present

## 2018-04-22 ENCOUNTER — Ambulatory Visit: Payer: Self-pay | Admitting: Family Medicine

## 2018-05-03 ENCOUNTER — Other Ambulatory Visit: Payer: Self-pay | Admitting: Family Medicine

## 2018-05-03 DIAGNOSIS — F32A Depression, unspecified: Secondary | ICD-10-CM

## 2018-05-03 DIAGNOSIS — I10 Essential (primary) hypertension: Secondary | ICD-10-CM

## 2018-05-03 DIAGNOSIS — F419 Anxiety disorder, unspecified: Secondary | ICD-10-CM

## 2018-05-03 DIAGNOSIS — F329 Major depressive disorder, single episode, unspecified: Secondary | ICD-10-CM

## 2018-05-04 ENCOUNTER — Ambulatory Visit: Payer: Self-pay | Admitting: Family Medicine

## 2018-05-11 ENCOUNTER — Ambulatory Visit: Payer: 59 | Admitting: Family Medicine

## 2018-05-11 ENCOUNTER — Encounter: Payer: Self-pay | Admitting: Family Medicine

## 2018-05-11 VITALS — BP 124/78 | HR 88 | Temp 98.6°F | Resp 16 | Ht 66.0 in | Wt 231.0 lb

## 2018-05-11 DIAGNOSIS — I10 Essential (primary) hypertension: Secondary | ICD-10-CM

## 2018-05-11 DIAGNOSIS — F329 Major depressive disorder, single episode, unspecified: Secondary | ICD-10-CM

## 2018-05-11 DIAGNOSIS — M542 Cervicalgia: Secondary | ICD-10-CM | POA: Diagnosis not present

## 2018-05-11 DIAGNOSIS — H1033 Unspecified acute conjunctivitis, bilateral: Secondary | ICD-10-CM | POA: Diagnosis not present

## 2018-05-11 DIAGNOSIS — K219 Gastro-esophageal reflux disease without esophagitis: Secondary | ICD-10-CM

## 2018-05-11 DIAGNOSIS — F419 Anxiety disorder, unspecified: Secondary | ICD-10-CM

## 2018-05-11 DIAGNOSIS — F32A Depression, unspecified: Secondary | ICD-10-CM

## 2018-05-11 DIAGNOSIS — E7849 Other hyperlipidemia: Secondary | ICD-10-CM

## 2018-05-11 MED ORDER — LOSARTAN POTASSIUM-HCTZ 50-12.5 MG PO TABS: 1.0000 | ORAL_TABLET | Freq: Every day | ORAL | 12 refills | Status: DC

## 2018-05-11 MED ORDER — TEMAZEPAM 30 MG PO CAPS: 30.0000 mg | ORAL_CAPSULE | Freq: Every day | ORAL | 5 refills | Status: DC

## 2018-05-11 MED ORDER — DULOXETINE HCL 60 MG PO CPEP: ORAL_CAPSULE | ORAL | 12 refills | Status: DC

## 2018-05-11 MED ORDER — GENTAMICIN SULFATE 0.3 % OP SOLN: 1.0000 [drp] | OPHTHALMIC | 0 refills | Status: DC

## 2018-05-11 MED ORDER — ALPRAZOLAM 0.5 MG PO TABS: 0.5000 mg | ORAL_TABLET | Freq: Two times a day (BID) | ORAL | 5 refills | Status: DC | PRN

## 2018-05-11 MED ORDER — ATORVASTATIN CALCIUM 20 MG PO TABS: ORAL_TABLET | ORAL | 12 refills | Status: DC

## 2018-05-11 MED ORDER — OMEPRAZOLE 20 MG PO CPDR: DELAYED_RELEASE_CAPSULE | ORAL | 12 refills | Status: DC

## 2018-05-11 NOTE — Progress Notes (Signed)
Patient: Danielle Walsh Female    DOB: [birth year]   62 y.o.   MRN: [id number] Visit Date: 05/11/2018  Today's Provider: Megan Mans, MD   No chief complaint on file.  Subjective:    HPI    Hypertension, follow-up:  BP Readings from Last 3 Encounters:  05/11/18 124/78  10/21/17 128/84  09/15/17 (!) 158/90    She was last seen for hypertension 6 months ago.  BP at that visit was 128/84. Management since that visit includes no changes. She reports good compliance with treatment. She is not having side effects.  She is not exercising. She is adherent to low salt diet.   Outside blood pressures are not being checked. She is experiencing none.  Patient denies exertional chest pressure/discomfort, lower extremity edema and palpitations.   Cardiovascular risk factors include dyslipidemia.  Weight trend: stable Wt Readings from Last 3 Encounters:  05/11/18 231 lb (104.8 kg)  10/21/17 227 lb (103 kg)  09/15/17 225 lb (102.1 kg)    Current diet: well balanced    Allergies  Allergen Reactions  . Hydrocodone-Acetaminophen   . Penicillins      Current Outpatient Medications:  .  acetaminophen (TYLENOL) 500 MG tablet, Take 1 tablet (500 mg total) by mouth every 6 (six) hours as needed., Disp: 30 tablet, Rfl: 0 .  ALPRAZolam (XANAX) 0.5 MG tablet, TAKE 1 TABLET BY MOUTH TWICE DAILY AS NEEDED FOR ANXIETY, Disp: 60 tablet, Rfl: 5 .  atorvastatin (LIPITOR) 20 MG tablet, TAKE 1 TABLET BY MOUTH EVERY DAY AT 6 PM, Disp: 90 tablet, Rfl: 3 .  cyclobenzaprine (FLEXERIL) 10 MG tablet, TAKE 1 TABLET BY MOUTH THREE TIMES DAILY AS NEEDED FOR MUSCLE SPASM, Disp: 360 tablet, Rfl: 0 .  DULoxetine (CYMBALTA) 60 MG capsule, TAKE 1 CAPSULE(60 MG) BY MOUTH DAILY, Disp: 90 capsule, Rfl: 3 .  hyoscyamine (LEVBID) 0.375 MG 12 hr tablet, Take by mouth., Disp: , Rfl:  .  losartan-hydrochlorothiazide (HYZAAR) 50-12.5 MG tablet, TAKE 1 TABLET BY MOUTH DAILY, Disp: 90 tablet, Rfl: 0 .   meclizine (ANTIVERT) 25 MG tablet, Take by mouth., Disp: , Rfl:  .  omeprazole (PRILOSEC) 20 MG capsule, TAKE 1 CAPSULE(20 MG) BY MOUTH DAILY, Disp: 90 capsule, Rfl: 3 .  Omeprazole 20 MG TBEC, Take 1 tablet (20 mg total) by mouth daily., Disp: 90 each, Rfl: 3 .  temazepam (RESTORIL) 30 MG capsule, TAKE ONE CAPSULE BY MOUTH AT BEDTIME, Disp: 30 capsule, Rfl: 5 .  azithromycin (ZITHROMAX) 250 MG tablet, Take 2 tablets PO on day one, and one tablet PO daily thereafter until completed. (Patient not taking: Reported on 10/21/2017), Disp: 6 tablet, Rfl: 0 .  benzonatate (TESSALON) 100 MG capsule, Take 1 capsule (100 mg total) by mouth 2 (two) times daily as needed for cough. (Patient not taking: Reported on 05/11/2018), Disp: 20 capsule, Rfl: 0 .  predniSONE (STERAPRED UNI-PAK 21 TAB) 10 MG (21) TBPK tablet, 6 day taper; take as directed on package instructions (Patient not taking: Reported on 10/21/2017), Disp: 21 tablet, Rfl: 0  Review of Systems  Constitutional: Negative.   Eyes: Negative.   Respiratory: Negative.   Cardiovascular: Negative.   Gastrointestinal: Negative.   Endocrine: Negative.   Allergic/Immunologic: Negative.   Neurological: Negative.   Hematological: Negative.   Psychiatric/Behavioral: Negative.     Social History   Tobacco Use  . Smoking status: Never Smoker  . Smokeless tobacco: Never Used  Substance Use Topics  . Alcohol  use: Yes    Alcohol/week: 0.0 standard drinks    Comment: occasionally   Objective:   BP 124/78 (BP Location: Left Arm, Patient Position: Sitting, Cuff Size: Large)   Pulse 88   Temp 98.6 F (37 C)   Resp 16   Ht 5\' 6"  (1.676 m)   Wt 231 lb (104.8 kg)   SpO2 96%   BMI 37.28 kg/m  Vitals:   05/11/18 1547  BP: 124/78  Pulse: 88  Resp: 16  Temp: 98.6 F (37 C)  SpO2: 96%  Weight: 231 lb (104.8 kg)  Height: 5\' 6"  (1.676 m)     Physical Exam  Constitutional: She is oriented to person, place, and time. She appears well-developed and  well-nourished.  HENT:  Head: Normocephalic and atraumatic.  Right Ear: External ear normal.  Left Ear: External ear normal.  Nose: Nose normal.  Eyes: Conjunctivae are normal. No scleral icterus.  Neck: No thyromegaly present.  Cardiovascular: Normal rate, regular rhythm and normal heart sounds.  Pulmonary/Chest: Effort normal and breath sounds normal.  Abdominal: Soft.  Musculoskeletal: She exhibits no edema.  Neurological: She is alert and oriented to person, place, and time.  Skin: Skin is warm and dry.  Psychiatric: She has a normal mood and affect. Her behavior is normal. Judgment and thought content normal.        Assessment & Plan:     1. Neck pain  - DG Cervical Spine Complete; Future  2. Acute conjunctivitis of both eyes, unspecified acute conjunctivitis type  - gentamicin (GARAMYCIN) 0.3 % ophthalmic solution; Place 1 drop into both eyes every 4 (four) hours.  Dispense: 5 mL; Refill: 0  3. Gastro-esophageal reflux disease without esophagitis  - omeprazole (PRILOSEC) 20 MG capsule; TAKE 1 CAPSULE(20 MG) BY MOUTH DAILY  Dispense: 30 capsule; Refill: 12  4. Anxiety and depression  - DULoxetine (CYMBALTA) 60 MG capsule; TAKE 1 CAPSULE(60 MG) BY MOUTH DAILY  Dispense: 30 capsule; Refill: 12 - temazepam (RESTORIL) 30 MG capsule; Take 1 capsule (30 mg total) by mouth at bedtime.  Dispense: 30 capsule; Refill: 5 - ALPRAZolam (XANAX) 0.5 MG tablet; Take 1 tablet (0.5 mg total) by mouth 2 (two) times daily as needed. for anxiety  Dispense: 60 tablet; Refill: 5  5. Essential (primary) hypertension  - losartan-hydrochlorothiazide (HYZAAR) 50-12.5 MG tablet; Take 1 tablet by mouth daily.  Dispense: 30 tablet; Refill: 12  6. Other hyperlipidemia - atorvastatin (LIPITOR) 20 MG tablet; TAKE 1 TABLET BY MOUTH EVERY DAY AT 6 PM  Dispense: 30 tablet; Refill: 12      I have done the exam and reviewed the chart and it is accurate to the best of my knowledge. Dentist  has been used and  any errors in dictation or transcription are unintentional. Julieanne Manson M.D. [facility]    Megan Mans, MD  [facility]

## 2018-07-23 ENCOUNTER — Encounter: Payer: Self-pay | Admitting: Family Medicine

## 2018-07-23 ENCOUNTER — Encounter: Payer: Self-pay | Admitting: Physician Assistant

## 2018-07-29 ENCOUNTER — Other Ambulatory Visit: Payer: Self-pay

## 2018-07-29 DIAGNOSIS — F32A Depression, unspecified: Secondary | ICD-10-CM

## 2018-07-29 DIAGNOSIS — F419 Anxiety disorder, unspecified: Principal | ICD-10-CM

## 2018-07-29 DIAGNOSIS — F329 Major depressive disorder, single episode, unspecified: Secondary | ICD-10-CM

## 2018-07-29 MED ORDER — DULOXETINE HCL 30 MG PO CPEP: ORAL_CAPSULE | ORAL | 12 refills | Status: DC

## 2018-08-16 ENCOUNTER — Telehealth: Payer: Self-pay | Admitting: Family Medicine

## 2018-08-16 NOTE — Telephone Encounter (Signed)
Pt called saying she is still having problems with getting the 90 mg of the Duloxetine.  She was taking 30 mg but Dr. Sullivan LoneGilbert upped it to 90mg  because of her depression.  The pharmacy needs an order that insurance will accept.   [place] the [facility] in [place]   CB# [phone]

## 2018-08-16 NOTE — Telephone Encounter (Signed)
[place]. Patient was advised.

## 2018-08-17 NOTE — Telephone Encounter (Signed)
PA was approved. 

## 2018-08-25 ENCOUNTER — Other Ambulatory Visit (HOSPITAL_COMMUNITY)
Admission: RE | Admit: 2018-08-25 | Discharge: 2018-08-25 | Disposition: A | Discharge status: Home / Self Care | Payer: 59 | Source: Ambulatory Visit | Attending: Physician Assistant | Admitting: Physician Assistant

## 2018-08-25 ENCOUNTER — Ambulatory Visit (INDEPENDENT_AMBULATORY_CARE_PROVIDER_SITE_OTHER): Payer: 59 | Admitting: Physician Assistant

## 2018-08-25 ENCOUNTER — Encounter: Payer: Self-pay | Admitting: Physician Assistant

## 2018-08-25 VITALS — BP 139/100 | HR 105 | Temp 99.0°F | Wt 227.8 lb

## 2018-08-25 DIAGNOSIS — Z124 Encounter for screening for malignant neoplasm of cervix: Secondary | ICD-10-CM | POA: Insufficient documentation

## 2018-08-25 DIAGNOSIS — I10 Essential (primary) hypertension: Secondary | ICD-10-CM

## 2018-08-25 DIAGNOSIS — Z0000 Encounter for general adult medical examination without abnormal findings: Secondary | ICD-10-CM

## 2018-08-25 DIAGNOSIS — Z1239 Encounter for other screening for malignant neoplasm of breast: Secondary | ICD-10-CM | POA: Diagnosis not present

## 2018-08-25 DIAGNOSIS — E7849 Other hyperlipidemia: Secondary | ICD-10-CM

## 2018-08-25 DIAGNOSIS — Z114 Encounter for screening for human immunodeficiency virus [HIV]: Secondary | ICD-10-CM

## 2018-08-25 DIAGNOSIS — Z1159 Encounter for screening for other viral diseases: Secondary | ICD-10-CM

## 2018-08-25 DIAGNOSIS — Z1211 Encounter for screening for malignant neoplasm of colon: Secondary | ICD-10-CM | POA: Diagnosis not present

## 2018-08-25 DIAGNOSIS — Z6836 Body mass index (BMI) 36.0-36.9, adult: Secondary | ICD-10-CM

## 2018-08-25 NOTE — Patient Instructions (Signed)
Health Maintenance for Postmenopausal Women Menopause is a normal process in which your reproductive ability comes to an end. This process happens gradually over a span of months to years, usually between the ages of 62 and 89. Menopause is complete when you have missed 12 consecutive menstrual periods. It is important to talk with your health care provider about some of the most common conditions that affect postmenopausal women, such as heart disease, cancer, and bone loss (osteoporosis). Adopting a healthy lifestyle and getting preventive care can help to promote your health and wellness. Those actions can also lower your chances of developing some of these common conditions. What should I know about menopause? During menopause, you may experience a number of symptoms, such as:  Moderate-to-severe hot flashes.  Night sweats.  Decrease in sex drive.  Mood swings.  Headaches.  Tiredness.  Irritability.  Memory problems.  Insomnia. Choosing to treat or not to treat menopausal changes is an individual decision that you make with your health care provider. What should I know about hormone replacement therapy and supplements? Hormone therapy products are effective for treating symptoms that are associated with menopause, such as hot flashes and night sweats. Hormone replacement carries certain risks, especially as you become older. If you are thinking about using estrogen or estrogen with progestin treatments, discuss the benefits and risks with your health care provider. What should I know about heart disease and stroke? Heart disease, heart attack, and stroke become more likely as you age. This may be due, in part, to the hormonal changes that your body experiences during menopause. These can affect how your body processes dietary fats, triglycerides, and cholesterol. Heart attack and stroke are both medical emergencies. There are many things that you can do to help prevent heart disease  and stroke:  Have your blood pressure checked at least every 1-2 years. High blood pressure causes heart disease and increases the risk of stroke.  If you are 79-72 years old, ask your health care provider if you should take aspirin to prevent a heart attack or a stroke.  Do not use any tobacco products, including cigarettes, chewing tobacco, or electronic cigarettes. If you need help quitting, ask your health care provider.  It is important to eat a healthy diet and maintain a healthy weight. ? Be sure to include plenty of vegetables, fruits, low-fat dairy products, and lean protein. ? Avoid eating foods that are high in solid fats, added sugars, or salt (sodium).  Get regular exercise. This is one of the most important things that you can do for your health. ? Try to exercise for at least 150 minutes each week. The type of exercise that you do should increase your heart rate and make you sweat. This is known as moderate-intensity exercise. ? Try to do strengthening exercises at least twice each week. Do these in addition to the moderate-intensity exercise.  Know your numbers.Ask your health care provider to check your cholesterol and your blood glucose. Continue to have your blood tested as directed by your health care provider.  What should I know about cancer screening? There are several types of cancer. Take the following steps to reduce your risk and to catch any cancer development as early as possible. Breast Cancer  Practice breast self-awareness. ? This means understanding how your breasts normally appear and feel. ? It also means doing regular breast self-exams. Let your health care provider know about any changes, no matter how small.  If you are 40 or  older, have a clinician do a breast exam (clinical breast exam or CBE) every year. Depending on your age, family history, and medical history, it may be recommended that you also have a yearly breast X-ray (mammogram).  If you  have a family history of breast cancer, talk with your health care provider about genetic screening.  If you are at high risk for breast cancer, talk with your health care provider about having an MRI and a mammogram every year.  Breast cancer (BRCA) gene test is recommended for women who have family members with BRCA-related cancers. Results of the assessment will determine the need for genetic counseling and BRCA1 and for BRCA2 testing. BRCA-related cancers include these types: ? Breast. This occurs in males or females. ? Ovarian. ? Tubal. This may also be called fallopian tube cancer. ? Cancer of the abdominal or pelvic lining (peritoneal cancer). ? Prostate. ? Pancreatic. Cervical, Uterine, and Ovarian Cancer Your health care provider may recommend that you be screened regularly for cancer of the pelvic organs. These include your ovaries, uterus, and vagina. This screening involves a pelvic exam, which includes checking for microscopic changes to the surface of your cervix (Pap test).  For women ages 21-65, health care providers may recommend a pelvic exam and a Pap test every three years. For women ages 39-65, they may recommend the Pap test and pelvic exam, combined with testing for human papilloma virus (HPV), every five years. Some types of HPV increase your risk of cervical cancer. Testing for HPV may also be done on women of any age who have unclear Pap test results.  Other health care providers may not recommend any screening for nonpregnant women who are considered low risk for pelvic cancer and have no symptoms. Ask your health care provider if a screening pelvic exam is right for you.  If you have had past treatment for cervical cancer or a condition that could lead to cancer, you need Pap tests and screening for cancer for at least 20 years after your treatment. If Pap tests have been discontinued for you, your risk factors (such as having a new sexual partner) need to be reassessed  to determine if you should start having screenings again. Some women have medical problems that increase the chance of getting cervical cancer. In these cases, your health care provider may recommend that you have screening and Pap tests more often.  If you have a family history of uterine cancer or ovarian cancer, talk with your health care provider about genetic screening.  If you have vaginal bleeding after reaching menopause, tell your health care provider.  There are currently no reliable tests available to screen for ovarian cancer. Lung Cancer Lung cancer screening is recommended for adults 57-50 years old who are at high risk for lung cancer because of a history of smoking. A yearly low-dose CT scan of the lungs is recommended if you:  Currently smoke.  Have a history of at least 30 pack-years of smoking and you currently smoke or have quit within the past 15 years. A pack-year is smoking an average of one pack of cigarettes per day for one year. Yearly screening should:  Continue until it has been 15 years since you quit.  Stop if you develop a health problem that would prevent you from having lung cancer treatment. Colorectal Cancer  This type of cancer can be detected and can often be prevented.  Routine colorectal cancer screening usually begins at age 12 and continues through  age 75.  If you have risk factors for colon cancer, your health care provider may recommend that you be screened at an earlier age.  If you have a family history of colorectal cancer, talk with your health care provider about genetic screening.  Your health care provider may also recommend using home test kits to check for hidden blood in your stool.  A small camera at the end of a tube can be used to examine your colon directly (sigmoidoscopy or colonoscopy). This is done to check for the earliest forms of colorectal cancer.  Direct examination of the colon should be repeated every 5-10 years until  age 75. However, if early forms of precancerous polyps or small growths are found or if you have a family history or genetic risk for colorectal cancer, you may need to be screened more often. Skin Cancer  Check your skin from head to toe regularly.  Monitor any moles. Be sure to tell your health care provider: ? About any new moles or changes in moles, especially if there is a change in a mole's shape or color. ? If you have a mole that is larger than the size of a pencil eraser.  If any of your family members has a history of skin cancer, especially at a young age, talk with your health care provider about genetic screening.  Always use sunscreen. Apply sunscreen liberally and repeatedly throughout the day.  Whenever you are outside, protect yourself by wearing long sleeves, pants, a wide-brimmed hat, and sunglasses. What should I know about osteoporosis? Osteoporosis is a condition in which bone destruction happens more quickly than new bone creation. After menopause, you may be at an increased risk for osteoporosis. To help prevent osteoporosis or the bone fractures that can happen because of osteoporosis, the following is recommended:  If you are 19-50 years old, get at least 1,000 mg of calcium and at least 600 mg of vitamin D per day.  If you are older than age 50 but younger than age 70, get at least 1,200 mg of calcium and at least 600 mg of vitamin D per day.  If you are older than age 70, get at least 1,200 mg of calcium and at least 800 mg of vitamin D per day. Smoking and excessive alcohol intake increase the risk of osteoporosis. Eat foods that are rich in calcium and vitamin D, and do weight-bearing exercises several times each week as directed by your health care provider. What should I know about how menopause affects my mental health? Depression may occur at any age, but it is more common as you become older. Common symptoms of depression include:  Low or sad  mood.  Changes in sleep patterns.  Changes in appetite or eating patterns.  Feeling an overall lack of motivation or enjoyment of activities that you previously enjoyed.  Frequent crying spells. Talk with your health care provider if you think that you are experiencing depression. What should I know about immunizations? It is important that you get and maintain your immunizations. These include:  Tetanus, diphtheria, and pertussis (Tdap) booster vaccine.  Influenza every year before the flu season begins.  Pneumonia vaccine.  Shingles vaccine. Your health care provider may also recommend other immunizations. This information is not intended to replace advice given to you by your health care provider. Make sure you discuss any questions you have with your health care provider. Document Released: 08/15/2005 Document Revised: 01/11/2016 Document Reviewed: 03/27/2015 Elsevier Interactive Patient Education    2019 [employer].

## 2018-08-25 NOTE — Progress Notes (Signed)
Patient: Danielle Walsh, Female    DOB: [birth year], 63 y.o.   MRN: [id number] Visit Date: 08/25/2018  Today's Provider: Margaretann Loveless, PA-C   Chief Complaint  Patient presents with  . Annual Exam   Subjective:     Annual physical exam JEWELIANNA Walsh is a 63 y.o. female who presents today for health maintenance and complete physical. She feels fairly well. She reports exercising, includes walking. She reports she is sleeping fairly well. -----------------------------------------------------------------   Review of Systems  Constitutional:       Irritability  HENT: Negative.   Eyes: Negative.   Respiratory: Negative.   Cardiovascular: Negative.   Gastrointestinal: Negative.   Endocrine: Negative.   Genitourinary: Negative.   Musculoskeletal: Positive for arthralgias.  Skin: Negative.   Allergic/Immunologic: Negative.   Neurological: Negative.   Hematological: Negative.   Psychiatric/Behavioral: The patient is nervous/anxious.     Social History      She  reports that she has never smoked. She has never used smokeless tobacco. She reports current alcohol use.       Social History   Socioeconomic History  . Marital status: Married    Spouse name: Not on file  . Number of children: Not on file  . Years of education: Not on file  . Highest education level: Not on file  Occupational History  . Not on file  Social Needs  . Financial resource strain: Not on file  . Food insecurity:    Worry: Not on file    Inability: Not on file  . Transportation needs:    Medical: Not on file    Non-medical: Not on file  Tobacco Use  . Smoking status: Never Smoker  . Smokeless tobacco: Never Used  Substance and Sexual Activity  . Alcohol use: Yes    Alcohol/week: 0.0 standard drinks    Comment: occasionally  . Drug use: Not on file  . Sexual activity: Not on file  Lifestyle  . Physical activity:    Days per week: Not on file    Minutes per session: Not on  file  . Stress: Not on file  Relationships  . Social connections:    Talks on phone: Not on file    Gets together: Not on file    Attends religious service: Not on file    Active member of club or organization: Not on file    Attends meetings of clubs or organizations: Not on file    Relationship status: Not on file  Other Topics Concern  . Not on file  Social History Narrative  . Not on file    No past medical history on file.   Patient Active Problem List   Diagnosis Date Noted  . Anxiety and depression 03/29/2015  . Essential (primary) hypertension 03/29/2015  . Gastro-esophageal reflux disease without esophagitis 03/29/2015  . HLD (hyperlipidemia) 03/29/2015  . 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 Cannot sleep 03/29/2015  . Headache, migraine 03/29/2015    Past Surgical History:  Procedure Laterality Date  . OTHER SURGICAL HISTORY     thermal ablasion-uterine area due to abnormal bleeding  . ROTATOR CUFF REPAIR    . TUBAL LIGATION      Family History        Family Status  Relation Name Status  . Mother  Alive  . Brother  Alive  . Father  Deceased at age 82        Her family history includes  Alzheimer's disease in her father; Arthritis in her mother; Diabetes in her brother; Hyperlipidemia in her brother; Hypertension in her mother.      Allergies  Allergen Reactions  . Hydrocodone-Acetaminophen   . Penicillins      Current Outpatient Medications:  .  acetaminophen (TYLENOL) 500 MG tablet, Take 1 tablet (500 mg total) by mouth every 6 (six) hours as needed., Disp: 30 tablet, Rfl: 0 .  ALPRAZolam (XANAX) 0.5 MG tablet, Take 1 tablet (0.5 mg total) by mouth 2 (two) times daily as needed. for anxiety, Disp: 60 tablet, Rfl: 5 .  atorvastatin (LIPITOR) 20 MG tablet, TAKE 1 TABLET BY MOUTH EVERY DAY AT 6 PM, Disp: 30 tablet, Rfl: 12 .  cyclobenzaprine (FLEXERIL) 10 MG tablet, TAKE 1 TABLET BY MOUTH THREE TIMES DAILY AS NEEDED FOR MUSCLE SPASM, Disp: 360 tablet, Rfl:  0 .  DULoxetine (CYMBALTA) 30 MG capsule, TAKE 3 CAPSULE(90 MG) BY MOUTH DAILY, Disp: 90 capsule, Rfl: 12 .  gentamicin (GARAMYCIN) 0.3 % ophthalmic solution, Place 1 drop into both eyes every 4 (four) hours., Disp: 5 mL, Rfl: 0 .  hyoscyamine (LEVBID) 0.375 MG 12 hr tablet, Take by mouth., Disp: , Rfl:  .  losartan-hydrochlorothiazide (HYZAAR) 50-12.5 MG tablet, Take 1 tablet by mouth daily., Disp: 30 tablet, Rfl: 12 .  meclizine (ANTIVERT) 25 MG tablet, Take by mouth., Disp: , Rfl:  .  omeprazole (PRILOSEC) 20 MG capsule, TAKE 1 CAPSULE(20 MG) BY MOUTH DAILY, Disp: 30 capsule, Rfl: 12 .  Omeprazole 20 MG TBEC, Take 1 tablet (20 mg total) by mouth daily., Disp: 90 each, Rfl: 3 .  temazepam (RESTORIL) 30 MG capsule, Take 1 capsule (30 mg total) by mouth at bedtime., Disp: 30 capsule, Rfl: 5 .  azithromycin (ZITHROMAX) 250 MG tablet, Take 2 tablets PO on day one, and one tablet PO daily thereafter until completed. (Patient not taking: Reported on 10/21/2017), Disp: 6 tablet, Rfl: 0 .  benzonatate (TESSALON) 100 MG capsule, Take 1 capsule (100 mg total) by mouth 2 (two) times daily as needed for cough. (Patient not taking: Reported on 05/11/2018), Disp: 20 capsule, Rfl: 0 .  predniSONE (STERAPRED UNI-PAK 21 TAB) 10 MG (21) TBPK tablet, 6 day taper; take as directed on package instructions (Patient not taking: Reported on 10/21/2017), Disp: 21 tablet, Rfl: 0   Patient Care Team: Maple HudsonGilbert, Richard L Jr., MD as PCP - General (Family Medicine)    Objective:    Vitals: BP (!) 139/100 (BP Location: Left Arm, Patient Position: Sitting, Cuff Size: Normal)   Pulse (!) 105   Temp 99 F (37.2 C) (Oral)   Wt 227 lb 12.8 oz (103.3 kg)   SpO2 97%   BMI 36.77 kg/m    Vitals:   08/25/18 1414  BP: (!) 139/100  Pulse: (!) 105  Temp: 99 F (37.2 C)  TempSrc: Oral  SpO2: 97%  Weight: 227 lb 12.8 oz (103.3 kg)     Physical Exam Vitals signs reviewed.  Constitutional:      General: She is not in acute  distress.    Appearance: Normal appearance. She is well-developed. She is not diaphoretic.  HENT:     Head: Normocephalic and atraumatic.     Right Ear: Hearing, tympanic membrane, ear canal and external ear normal.     Left Ear: Hearing, tympanic membrane, ear canal and external ear normal.     Nose: Nose normal.     Mouth/Throat:     Mouth: Mucous membranes are moist.  Pharynx: Oropharynx is clear. Uvula midline. No oropharyngeal exudate.  Eyes:     General: No scleral icterus.       Right eye: No discharge.        Left eye: No discharge.     Conjunctiva/sclera: Conjunctivae normal.     Pupils: Pupils are equal, round, and reactive to light.  Neck:     Musculoskeletal: Normal range of motion and neck supple.     Thyroid: No thyromegaly.     Vascular: No carotid bruit or JVD.     Trachea: No tracheal deviation.  Cardiovascular:     Rate and Rhythm: Normal rate and regular rhythm.     Pulses: Normal pulses.     Heart sounds: Normal heart sounds. No murmur. No friction rub. No gallop.   Pulmonary:     Effort: Pulmonary effort is normal. No respiratory distress.     Breath sounds: Normal breath sounds. No wheezing or rales.  Chest:     Chest wall: No tenderness.     Breasts: Breasts are symmetrical.        Right: No inverted nipple, mass, nipple discharge, skin change or tenderness.        Left: No inverted nipple, mass, nipple discharge, skin change or tenderness.  Abdominal:     General: Bowel sounds are normal. There is no distension.     Palpations: Abdomen is soft. There is no mass.     Tenderness: There is no abdominal tenderness. There is no guarding or rebound.     Hernia: There is no hernia in the right inguinal area or left inguinal area.  Genitourinary:    Exam position: Supine.     Labia:        Right: No rash, tenderness, lesion or injury.        Left: No rash, tenderness, lesion or injury.      Vagina: Normal. No signs of injury. No vaginal discharge,  erythema, tenderness or bleeding.     Cervix: No cervical motion tenderness, discharge or friability.     Adnexa:        Right: No mass, tenderness or fullness.         Left: No mass, tenderness or fullness.       Rectum: Normal.  Musculoskeletal: Normal range of motion.        General: No tenderness.  Lymphadenopathy:     Cervical: No cervical adenopathy.  Skin:    General: Skin is warm and dry.     Findings: No rash.  Neurological:     Mental Status: She is alert and oriented to person, place, and time.     Cranial Nerves: No cranial nerve deficit.     Coordination: Coordination normal.     Deep Tendon Reflexes: Reflexes are normal and symmetric.  Psychiatric:        Mood and Affect: Mood normal.        Behavior: Behavior normal.        Thought Content: Thought content normal.        Judgment: Judgment normal.      Depression Screen PHQ 2/9 Scores 08/25/2018 04/13/2017 10/25/2015  PHQ - 2 Score 2 0 4  PHQ- 9 Score 6 2 19        Assessment & Plan:     Routine Health Maintenance and Physical Exam  Exercise Activities and Dietary recommendations Goals   None     Immunization History  Administered Date(s) Administered  . Influenza,inj,Quad PF,6+ Mos  04/25/2015, 04/09/2016, 04/13/2017  . Tdap 04/09/2016    Health Maintenance  Topic Date Due  . Hepatitis C Screening  [birth year]  . HIV Screening  10/14/1971  . COLONOSCOPY  10/14/2006  . MAMMOGRAM  09/18/2017  . INFLUENZA VACCINE  02/04/2018  . PAP SMEAR-Modifier  09/19/2018  . TETANUS/TDAP  04/09/2026     Discussed health benefits of physical activity, and encouraged her to engage in regular exercise appropriate for her age and condition.    1. Annual physical exam Normal physical exam today. Will check labs as below and f/u pending lab results. If labs are stable and WNL she will not need to have these rechecked for one year at her next annual physical exam. She is to call the office in the meantime if she  has any acute issue, questions or concerns. - CBC w/Diff/Platelet - Comprehensive Metabolic Panel (CMET) - TSH - Lipid Profile - HgB A1c  2. Breast cancer screening Breast exam today was normal. There is no family history of breast cancer. She does perform regular self breast exams. Mammogram was ordered as below. [facility] given to patient so she may schedule her mammogram at her convenience.  3. Cervical cancer screening Pap collected today. Will send as below and f/u pending results. - Cytology - PAP  4. Colon cancer screening Due for colonoscopy. Referral placed.  - Ambulatory referral to Gastroenterology  5. Class 2 severe obesity due to excess calories with serious comorbidity and body mass index (BMI) of 36.0 to 36.9 in adult [facility](HCC) Counseled patient on healthy lifestyle modifications including dieting and exercise.  - Comprehensive Metabolic Panel (CMET) - TSH - HgB A1c  6. Essential (primary) hypertension Stable. Continue Hyzaar 50-12.5mg . Will check labs as below and f/u pending results. - CBC w/Diff/Platelet - Comprehensive Metabolic Panel (CMET) - Lipid Profile - HgB A1c  7. Other hyperlipidemia Stable. Continue atorvastatin 20mg . Will check labs as below and f/u pending results. - CBC w/Diff/Platelet - Comprehensive Metabolic Panel (CMET) - Lipid Profile - HgB A1c  8. Encounter for hepatitis C screening test for low risk patient Will check labs as below and f/u pending results. - Hepatitis C Antibody  9. Screening for HIV without presence of risk factors Will check labs as below and f/u pending results. - HIV antibody (with reflex)  --------------------------------------------------------------------    Margaretann LovelessJennifer M Burnette, PA-C  [facility]

## 2018-08-30 ENCOUNTER — Telehealth: Payer: Self-pay

## 2018-08-30 LAB — CYTOLOGY - PAP
Diagnosis: NEGATIVE
HPV: NOT DETECTED

## 2018-08-30 NOTE — Telephone Encounter (Signed)
-----   Message from Margaretann Loveless, [place] sent at 08/30/2018 12:44 PM EST ----- Pap is normal, HPV negative.  Will repeat in 3-5 years.

## 2018-08-30 NOTE — Telephone Encounter (Signed)
Viewed by Clide Dales on 08/30/2018 1:12 PM

## 2018-09-06 ENCOUNTER — Other Ambulatory Visit: Payer: Self-pay

## 2018-09-06 MED ORDER — HYDROCHLOROTHIAZIDE 25 MG PO TABS: 12.5000 mg | ORAL_TABLET | Freq: Every day | ORAL | 3 refills | Status: DC

## 2018-09-06 MED ORDER — LOSARTAN POTASSIUM 50 MG PO TABS: 50.0000 mg | ORAL_TABLET | Freq: Every day | ORAL | 3 refills | Status: DC

## 2018-12-04 ENCOUNTER — Other Ambulatory Visit: Payer: Self-pay | Admitting: Family Medicine

## 2018-12-04 DIAGNOSIS — F419 Anxiety disorder, unspecified: Secondary | ICD-10-CM

## 2018-12-04 DIAGNOSIS — F329 Major depressive disorder, single episode, unspecified: Secondary | ICD-10-CM

## 2018-12-04 DIAGNOSIS — F32A Depression, unspecified: Secondary | ICD-10-CM

## 2019-03-01 ENCOUNTER — Encounter: Payer: Self-pay | Admitting: Family Medicine

## 2019-03-02 ENCOUNTER — Other Ambulatory Visit: Payer: Self-pay

## 2019-03-02 DIAGNOSIS — Z20822 Contact with and (suspected) exposure to covid-19: Secondary | ICD-10-CM

## 2019-03-03 LAB — NOVEL CORONAVIRUS, NAA: SARS-CoV-2, NAA: NOT DETECTED

## 2019-04-06 ENCOUNTER — Other Ambulatory Visit: Payer: Self-pay

## 2019-04-06 ENCOUNTER — Encounter: Payer: Self-pay | Admitting: Family Medicine

## 2019-04-06 ENCOUNTER — Ambulatory Visit (INDEPENDENT_AMBULATORY_CARE_PROVIDER_SITE_OTHER): Payer: 59 | Admitting: Family Medicine

## 2019-04-06 VITALS — BP 132/72 | HR 72 | Temp 98.6°F | Resp 16 | Ht 66.0 in | Wt 234.0 lb

## 2019-04-06 DIAGNOSIS — E7849 Other hyperlipidemia: Secondary | ICD-10-CM

## 2019-04-06 DIAGNOSIS — F32A Depression, unspecified: Secondary | ICD-10-CM

## 2019-04-06 DIAGNOSIS — Z23 Encounter for immunization: Secondary | ICD-10-CM | POA: Diagnosis not present

## 2019-04-06 DIAGNOSIS — I10 Essential (primary) hypertension: Secondary | ICD-10-CM

## 2019-04-06 DIAGNOSIS — F419 Anxiety disorder, unspecified: Secondary | ICD-10-CM

## 2019-04-06 DIAGNOSIS — Z1159 Encounter for screening for other viral diseases: Secondary | ICD-10-CM

## 2019-04-06 DIAGNOSIS — R739 Hyperglycemia, unspecified: Secondary | ICD-10-CM

## 2019-04-06 DIAGNOSIS — Z114 Encounter for screening for human immunodeficiency virus [HIV]: Secondary | ICD-10-CM

## 2019-04-06 DIAGNOSIS — M62838 Other muscle spasm: Secondary | ICD-10-CM | POA: Diagnosis not present

## 2019-04-06 DIAGNOSIS — F329 Major depressive disorder, single episode, unspecified: Secondary | ICD-10-CM

## 2019-04-06 DIAGNOSIS — K219 Gastro-esophageal reflux disease without esophagitis: Secondary | ICD-10-CM

## 2019-04-06 MED ORDER — CYCLOBENZAPRINE HCL 10 MG PO TABS: 10.0000 mg | ORAL_TABLET | Freq: Three times a day (TID) | ORAL | 0 refills | Status: DC | PRN

## 2019-04-06 NOTE — Progress Notes (Signed)
Patient: Danielle Walsh Female    DOB: 05-22-1956   63 y.o.   MRN: [id number] Visit Date: 04/06/2019  Today's Provider: Wilhemena Durie, MD   Chief Complaint  Patient presents with  . Hypertension  . Hyperlipidemia   Subjective:   HPI Patient comes in today for a follow up. She was last seen in the office 6 months ago. She is having some family stress.  Her husband has had some significant health issues this year also. Hypertension No medications were changed since last visit. Patient is currently taking Hyzaar 50/12.5mg  once daily, and reports good compliance and good symptom control.  BP Readings from Last 3 Encounters:  04/06/19 132/72  08/25/18 (!) 139/100  05/11/18 124/78   Hyperlipidemia  No medications were changed since last visit. Patient is currently taking atorvastatin 20mg  once daily.  Lipid Panel     Component Value Date/Time   CHOL 161 04/23/2017 1142   CHOL 159 11/02/2015 0817   TRIG 83 04/23/2017 1142   HDL 63 04/23/2017 1142   HDL 55 11/02/2015 0817   CHOLHDL 2.6 04/23/2017 1142   LDLCALC 81 04/23/2017 1142   LABVLDL 18 11/02/2015 0817     Allergies  Allergen Reactions  . Hydrocodone-Acetaminophen   . Penicillins      Current Outpatient Medications:  .  acetaminophen (TYLENOL) 500 MG tablet, Take 1 tablet (500 mg total) by mouth every 6 (six) hours as needed., Disp: 30 tablet, Rfl: 0 .  ALPRAZolam (XANAX) 0.5 MG tablet, TAKE 1 TABLET(0.5 MG) BY MOUTH TWICE DAILY AS NEEDED FOR ANXIETY, Disp: 60 tablet, Rfl: 3 .  atorvastatin (LIPITOR) 20 MG tablet, TAKE 1 TABLET BY MOUTH EVERY DAY AT 6 PM, Disp: 30 tablet, Rfl: 12 .  cyclobenzaprine (FLEXERIL) 10 MG tablet, TAKE 1 TABLET BY MOUTH THREE TIMES DAILY AS NEEDED FOR MUSCLE SPASM, Disp: 360 tablet, Rfl: 0 .  DULoxetine (CYMBALTA) 30 MG capsule, TAKE 3 CAPSULE(90 MG) BY MOUTH DAILY, Disp: 90 capsule, Rfl: 12 .  gentamicin (GARAMYCIN) 0.3 % ophthalmic solution, Place 1 drop into both eyes every 4  (four) hours., Disp: 5 mL, Rfl: 0 .  hydrochlorothiazide (HYDRODIURIL) 25 MG tablet, Take 0.5 tablets (12.5 mg total) by mouth daily., Disp: 45 tablet, Rfl: 3 .  hyoscyamine (LEVBID) 0.375 MG 12 hr tablet, Take by mouth., Disp: , Rfl:  .  losartan (COZAAR) 50 MG tablet, Take 1 tablet (50 mg total) by mouth daily., Disp: 90 tablet, Rfl: 3 .  losartan-hydrochlorothiazide (HYZAAR) 50-12.5 MG tablet, Take 1 tablet by mouth daily., Disp: 30 tablet, Rfl: 12 .  meclizine (ANTIVERT) 25 MG tablet, Take by mouth., Disp: , Rfl:  .  omeprazole (PRILOSEC) 20 MG capsule, TAKE 1 CAPSULE(20 MG) BY MOUTH DAILY, Disp: 30 capsule, Rfl: 12 .  Omeprazole 20 MG TBEC, Take 1 tablet (20 mg total) by mouth daily., Disp: 90 each, Rfl: 3 .  temazepam (RESTORIL) 30 MG capsule, TAKE 1 CAPSULE(30 MG) BY MOUTH AT BEDTIME, Disp: 30 capsule, Rfl: 3  Review of Systems  Constitutional: Negative for activity change and fatigue.  Eyes: Negative.   Respiratory: Negative for cough and shortness of breath.   Cardiovascular: Negative for chest pain, palpitations and leg swelling.  Endocrine: Negative.   Genitourinary: Negative for hematuria.  Musculoskeletal: Negative for myalgias.  Allergic/Immunologic: Negative.   Neurological: Negative for dizziness and headaches.  Psychiatric/Behavioral: Negative for agitation, self-injury, sleep disturbance and suicidal ideas. The patient is not nervous/anxious.  Social History   Tobacco Use  . Smoking status: Never Smoker  . Smokeless tobacco: Never Used  Substance Use Topics  . Alcohol use: Yes    Alcohol/week: 0.0 standard drinks    Comment: occasionally      Objective:   BP 132/72   Pulse 72   Temp 98.6 F (37 C)   Resp 16   Ht 5\' 6"  (1.676 m)   Wt 234 lb (106.1 kg)   SpO2 96%   BMI 37.77 kg/m  Vitals:   04/06/19 0823  BP: 132/72  Pulse: 72  Resp: 16  Temp: 98.6 F (37 C)  SpO2: 96%  Weight: 234 lb (106.1 kg)  Height: 5\' 6"  (1.676 m)  Body mass index is  37.77 kg/m.   Physical Exam Vitals signs reviewed.  Constitutional:      Appearance: She is well-developed.  HENT:     Head: Normocephalic and atraumatic.     Right Ear: External ear normal.     Left Ear: External ear normal.     Nose: Nose normal.  Eyes:     General: No scleral icterus.    Conjunctiva/sclera: Conjunctivae normal.  Neck:     Thyroid: No thyromegaly.  Cardiovascular:     Rate and Rhythm: Normal rate and regular rhythm.     Heart sounds: Normal heart sounds.  Pulmonary:     Effort: Pulmonary effort is normal.     Breath sounds: Normal breath sounds.  Abdominal:     Palpations: Abdomen is soft.  Skin:    General: Skin is warm and dry.  Neurological:     General: No focal deficit present.     Mental Status: She is alert and oriented to person, place, and time.  Psychiatric:        Mood and Affect: Mood normal.        Behavior: Behavior normal.        Thought Content: Thought content normal.        Judgment: Judgment normal.      No results found for any visits on 04/06/19.     Assessment & Plan    1. Muscle spasm Mainly due to cervical strain.  Refill Flexeril - cyclobenzaprine (FLEXERIL) 10 MG tablet; Take 1 tablet (10 mg total) by mouth 3 (three) times daily as needed. for muscle spams  Dispense: 270 tablet; Refill: 0  2. Other hyperlipidemia On atorvastatin. - Lipid panel - TSH  3. Essential (primary) hypertension On Cozaar HCT - Comprehensive metabolic panel  4. Anxiety and depression On Cymbalta and alprazolam.  Consider increasing Cymbalta in the future.More than 50% 25 minute visit spent in counseling or coordination of care  - CBC with Differential/Platelet  5. Gastro-esophageal reflux disease without esophagitis   6. Need for influenza vaccination   7. Encounter for hepatitis C screening test for low risk patient  - Hepatitis C antibody  8. Screening for HIV without presence of risk factors  - HIV antibody (with reflex)   9. Hyperglycemia Prediabetes. - Hemoglobin A1c     , MD  [facility]

## 2019-04-07 LAB — CBC WITH DIFFERENTIAL/PLATELET
Basophils Absolute: 0.1 10*3/uL (ref 0.0–0.2)
Basos: 1 %
EOS (ABSOLUTE): 0.1 10*3/uL (ref 0.0–0.4)
Eos: 2 %
Hematocrit: 41 % (ref 34.0–46.6)
Hemoglobin: 13.4 g/dL (ref 11.1–15.9)
Immature Grans (Abs): 0 10*3/uL (ref 0.0–0.1)
Immature Granulocytes: 0 %
Lymphocytes Absolute: 2.1 10*3/uL (ref 0.7–3.1)
Lymphs: 32 %
MCH: 30.2 pg (ref 26.6–33.0)
MCHC: 32.7 g/dL (ref 31.5–35.7)
MCV: 93 fL (ref 79–97)
Monocytes Absolute: 0.4 10*3/uL (ref 0.1–0.9)
Monocytes: 6 %
Neutrophils Absolute: 4 10*3/uL (ref 1.4–7.0)
Neutrophils: 59 %
Platelets: 173 10*3/uL (ref 150–450)
RBC: 4.43 x10E6/uL (ref 3.77–5.28)
RDW: 12.6 % (ref 11.7–15.4)
WBC: 6.7 10*3/uL (ref 3.4–10.8)

## 2019-04-07 LAB — HIV ANTIBODY (ROUTINE TESTING W REFLEX): HIV Screen 4th Generation wRfx: NONREACTIVE

## 2019-04-07 LAB — COMPREHENSIVE METABOLIC PANEL
ALT: 13 IU/L (ref 0–32)
AST: 18 IU/L (ref 0–40)
Albumin/Globulin Ratio: 2 (ref 1.2–2.2)
Albumin: 4.6 g/dL (ref 3.8–4.8)
Alkaline Phosphatase: 118 IU/L — ABNORMAL HIGH (ref 39–117)
BUN/Creatinine Ratio: 11 — ABNORMAL LOW (ref 12–28)
BUN: 14 mg/dL (ref 8–27)
Bilirubin Total: 0.7 mg/dL (ref 0.0–1.2)
CO2: 24 mmol/L (ref 20–29)
Calcium: 9.9 mg/dL (ref 8.7–10.3)
Chloride: 102 mmol/L (ref 96–106)
Creatinine, Ser: 1.27 mg/dL — ABNORMAL HIGH (ref 0.57–1.00)
GFR calc Af Amer: 52 mL/min/{1.73_m2} — ABNORMAL LOW (ref >59)
GFR calc non Af Amer: 45 mL/min/{1.73_m2} — ABNORMAL LOW (ref >59)
Globulin, Total: 2.3 g/dL (ref 1.5–4.5)
Glucose: 102 mg/dL — ABNORMAL HIGH (ref 65–99)
Potassium: 4 mmol/L (ref 3.5–5.2)
Sodium: 141 mmol/L (ref 134–144)
Total Protein: 6.9 g/dL (ref 6.0–8.5)

## 2019-04-07 LAB — LIPID PANEL
Chol/HDL Ratio: 3.2 ratio (ref 0.0–4.4)
Cholesterol, Total: 185 mg/dL (ref 100–199)
HDL: 58 mg/dL (ref >39)
LDL Chol Calc (NIH): 109 mg/dL — ABNORMAL HIGH (ref 0–99)
Triglycerides: 100 mg/dL (ref 0–149)
VLDL Cholesterol Cal: 18 mg/dL (ref 5–40)

## 2019-04-07 LAB — HEMOGLOBIN A1C
Est. average glucose Bld gHb Est-mCnc: 120 mg/dL
Hgb A1c MFr Bld: 5.8 % — ABNORMAL HIGH (ref 4.8–5.6)

## 2019-04-07 LAB — TSH: TSH: 3.14 u[IU]/mL (ref 0.450–4.500)

## 2019-04-07 LAB — HEPATITIS C ANTIBODY: Hep C Virus Ab: <0.1 s/co ratio (ref 0.0–0.9)

## 2019-04-13 NOTE — Addendum Note (Signed)
Addended by: Wilder Glade on: 04/13/2019 09:57 AM   Modules accepted: Orders

## 2019-04-20 ENCOUNTER — Encounter: Payer: Self-pay | Admitting: Family Medicine

## 2019-04-22 ENCOUNTER — Encounter: Payer: Self-pay | Admitting: Family Medicine

## 2019-04-25 ENCOUNTER — Telehealth: Payer: Self-pay | Admitting: *Deleted

## 2019-04-25 MED ORDER — HYOSCYAMINE SULFATE ER 0.375 MG PO TB12: 0.3750 mg | ORAL_TABLET | Freq: Two times a day (BID) | ORAL | 12 refills | Status: DC | PRN

## 2019-04-25 NOTE — Telephone Encounter (Signed)
Rx sent to pharmacy   

## 2019-06-08 ENCOUNTER — Other Ambulatory Visit: Payer: Self-pay | Admitting: Family Medicine

## 2019-06-08 DIAGNOSIS — I10 Essential (primary) hypertension: Secondary | ICD-10-CM

## 2019-06-08 DIAGNOSIS — F419 Anxiety disorder, unspecified: Secondary | ICD-10-CM

## 2019-06-08 DIAGNOSIS — E7849 Other hyperlipidemia: Secondary | ICD-10-CM

## 2019-06-08 DIAGNOSIS — K219 Gastro-esophageal reflux disease without esophagitis: Secondary | ICD-10-CM

## 2019-06-08 DIAGNOSIS — F32A Depression, unspecified: Secondary | ICD-10-CM

## 2019-06-08 NOTE — Telephone Encounter (Signed)
[facility] faxed refill request for the following medications:  atorvastatin (LIPITOR) 20 MG tablet  ALPRAZolam (XANAX) 0.5 MG tablet  temazepam (RESTORIL) 30 MG capsule  losartan-hydrochlorothiazide (HYZAAR) 50-12.5 MG tablet  omeprazole (PRILOSEC) 20 MG capsule   Please advise.

## 2019-06-09 MED ORDER — ALPRAZOLAM 0.5 MG PO TABS: ORAL_TABLET | ORAL | 3 refills | Status: DC

## 2019-06-09 MED ORDER — TEMAZEPAM 30 MG PO CAPS: ORAL_CAPSULE | ORAL | 3 refills | Status: DC

## 2019-06-09 MED ORDER — LOSARTAN POTASSIUM-HCTZ 50-12.5 MG PO TABS: 1.0000 | ORAL_TABLET | Freq: Every day | ORAL | 12 refills | Status: DC

## 2019-06-09 MED ORDER — OMEPRAZOLE 20 MG PO CPDR: DELAYED_RELEASE_CAPSULE | ORAL | 12 refills | Status: DC

## 2019-06-09 MED ORDER — ATORVASTATIN CALCIUM 20 MG PO TABS: ORAL_TABLET | ORAL | 12 refills | Status: DC

## 2019-07-28 NOTE — Progress Notes (Signed)
Patient: Danielle Walsh Female    DOB: [birth year]   64 y.o.   MRN: [id number] Visit Date: 07/29/2019  Today's Provider: Trinna Post, PA-C   Chief Complaint  Patient presents with  . Pelvic Pain   Subjective:     Pelvic Pain The patient's primary symptoms include pelvic pain. This is a new problem. The current episode started 1 to 4 weeks ago. The problem has been unchanged. The problem affects both sides. Associated symptoms include frequency (at night per pt). Pertinent negatives include no abdominal pain or back pain. The symptoms are aggravated by activity.   Patient has a history of pelvic pain, pressure, and urinary frequency which worsen at night and worsen as the day goes on. She has had 4 children in 7 years, ranging from 6-7lbs. She denies vaginal burning, dysuria, vaginal discharge or vaginal bleeding.      Allergies  Allergen Reactions  . Hydrocodone-Acetaminophen   . Penicillins      Current Outpatient Medications:  .  acetaminophen (TYLENOL) 500 MG tablet, Take 1 tablet (500 mg total) by mouth every 6 (six) hours as needed., Disp: 30 tablet, Rfl: 0 .  ALPRAZolam (XANAX) 0.5 MG tablet, TAKE 1 TABLET(0.5 MG) BY MOUTH TWICE DAILY AS NEEDED FOR ANXIETY, Disp: 60 tablet, Rfl: 3 .  atorvastatin (LIPITOR) 20 MG tablet, TAKE 1 TABLET BY MOUTH EVERY DAY AT 6 PM, Disp: 30 tablet, Rfl: 12 .  cyclobenzaprine (FLEXERIL) 10 MG tablet, Take 1 tablet (10 mg total) by mouth 3 (three) times daily as needed. for muscle spams, Disp: 270 tablet, Rfl: 0 .  DULoxetine (CYMBALTA) 30 MG capsule, TAKE 3 CAPSULE(90 MG) BY MOUTH DAILY, Disp: 90 capsule, Rfl: 12 .  gentamicin (GARAMYCIN) 0.3 % ophthalmic solution, Place 1 drop into both eyes every 4 (four) hours., Disp: 5 mL, Rfl: 0 .  hydrochlorothiazide (HYDRODIURIL) 25 MG tablet, Take 0.5 tablets (12.5 mg total) by mouth daily., Disp: 45 tablet, Rfl: 3 .  hyoscyamine (LEVBID) 0.375 MG 12 hr tablet, Take 1 tablet (0.375 mg total)  by mouth every 12 (twelve) hours as needed., Disp: 60 tablet, Rfl: 12 .  losartan (COZAAR) 50 MG tablet, Take 1 tablet (50 mg total) by mouth daily., Disp: 90 tablet, Rfl: 3 .  losartan-hydrochlorothiazide (HYZAAR) 50-12.5 MG tablet, Take 1 tablet by mouth daily., Disp: 30 tablet, Rfl: 12 .  meclizine (ANTIVERT) 25 MG tablet, Take by mouth., Disp: , Rfl:  .  omeprazole (PRILOSEC) 20 MG capsule, TAKE 1 CAPSULE(20 MG) BY MOUTH DAILY, Disp: 30 capsule, Rfl: 12 .  Omeprazole 20 MG TBEC, Take 1 tablet (20 mg total) by mouth daily., Disp: 90 each, Rfl: 3 .  temazepam (RESTORIL) 30 MG capsule, TAKE 1 CAPSULE(30 MG) BY MOUTH AT BEDTIME, Disp: 30 capsule, Rfl: 3  Review of Systems  Gastrointestinal: Negative for abdominal pain.  Genitourinary: Positive for frequency (at night per pt) and pelvic pain.  Musculoskeletal: Negative for back pain.    Social History   Tobacco Use  . Smoking status: Never Smoker  . Smokeless tobacco: Never Used  Substance Use Topics  . Alcohol use: Yes    Alcohol/week: 0.0 standard drinks    Comment: occasionally      Objective:   There were no vitals taken for this visit. There were no vitals filed for this visit.There is no height or weight on file to calculate BMI.   Physical Exam Constitutional:      Appearance: Normal appearance.  Cardiovascular:     Rate and Rhythm: Normal rate.  Pulmonary:     Effort: Pulmonary effort is normal.  Genitourinary:    Vagina: Normal.     Cervix: Normal.     Uterus: Normal.      Comments: Perhaps some slight anterior pressure noted on bimanual exam.  Neurological:     Mental Status: She is alert and oriented to person, place, and time. Mental status is at baseline.  Psychiatric:        Mood and Affect: Mood normal.        Behavior: Behavior normal.      No results found for any visits on 07/29/19.     Assessment & Plan    1. Urinary frequency  Will refer to OBGYN for further evaluation of likely prolapse.    - POCT urinalysis dipstick - CULTURE, URINE COMPREHENSIVE  2. Pelvic pressure in female  - POCT urinalysis dipstick  The entirety of the information documented in the History of Present Illness, Review of Systems and Physical Exam were personally obtained by me. Portions of this information were initially documented by [facility] and reviewed by me for thoroughness and accuracy.       Trey Sailors, PA-C  [facility]

## 2019-07-29 ENCOUNTER — Encounter: Payer: Self-pay | Admitting: Physician Assistant

## 2019-07-29 ENCOUNTER — Ambulatory Visit (INDEPENDENT_AMBULATORY_CARE_PROVIDER_SITE_OTHER): Payer: 59 | Admitting: Physician Assistant

## 2019-07-29 ENCOUNTER — Ambulatory Visit: Payer: 59 | Admitting: Physician Assistant

## 2019-07-29 ENCOUNTER — Other Ambulatory Visit: Payer: Self-pay | Admitting: Physician Assistant

## 2019-07-29 ENCOUNTER — Other Ambulatory Visit: Payer: Self-pay

## 2019-07-29 DIAGNOSIS — R102 Pelvic and perineal pain: Secondary | ICD-10-CM | POA: Diagnosis not present

## 2019-07-29 DIAGNOSIS — R350 Frequency of micturition: Secondary | ICD-10-CM | POA: Diagnosis not present

## 2019-07-29 NOTE — Patient Instructions (Signed)
Pelvic Organ Prolapse Pelvic organ prolapse is the stretching, bulging, or dropping of pelvic organs into an abnormal position. It happens when the muscles and tissues that surround and support pelvic structures become weak or stretched. Pelvic organ prolapse can involve the:  Vagina (vaginal prolapse).  Uterus (uterine prolapse).  Bladder (cystocele).  Rectum (rectocele).  Intestines (enterocele). When organs other than the vagina are involved, they often bulge into the vagina or protrude from the vagina, depending on how severe the prolapse is. What are the causes? This condition may be caused by:  Pregnancy, labor, and childbirth.  Past pelvic surgery.  Decreased production of the hormone estrogen associated with menopause.  Consistently lifting more than 50 lb (23 kg).  Obesity.  Long-term inability to pass stool (chronic constipation).  A cough that lasts a long time (chronic).  Buildup of fluid in the abdomen due to certain diseases and other conditions. What are the signs or symptoms? Symptoms of this condition include:  Passing a little urine (loss of bladder control) when you cough, sneeze, strain, and exercise (stress incontinence). This may be worse immediately after childbirth. It may gradually improve over time.  Feeling pressure in your pelvis or vagina. This pressure may increase when you cough or when you are passing stool.  A bulge that protrudes from the opening of your vagina.  Difficulty passing urine or stool.  Pain in your lower back.  Pain, discomfort, or disinterest in sex.  Repeated bladder infections (urinary tract infections).  Difficulty inserting a tampon. In some people, this condition causes no symptoms. How is this diagnosed? This condition may be diagnosed based on a vaginal and rectal exam. During the exam, you may be asked to cough and strain while you are lying down, sitting, and standing up. Your health care provider will  determine if other tests are required, such as bladder function tests. How is this treated? Treatment for this condition may depend on your symptoms. Treatment may include:  Lifestyle changes, such as changes to your diet.  Emptying your bladder at scheduled times (bladder training therapy). This can help reduce or avoid urinary incontinence.  Estrogen. Estrogen may help mild prolapse by increasing the strength and tone of pelvic floor muscles.  Kegel exercises. These may help mild cases of prolapse by strengthening and tightening the muscles of the pelvic floor.  A soft, flexible device that helps support the vaginal walls and keep pelvic organs in place (pessary). This is inserted into your vagina by your health care provider.  Surgery. This is often the only form of treatment for severe prolapse. Follow these instructions at home:  Avoid drinking beverages that contain caffeine or alcohol.  Increase your intake of high-fiber foods. This can help decrease constipation and straining during bowel movements.  Lose weight if recommended by your health care provider.  Wear a sanitary pad or adult diapers if you have urinary incontinence.  Avoid heavy lifting and straining with exercise and work. Do not hold your breath when you perform mild to moderate lifting and exercise activities. Limit your activities as directed by your health care provider.  Do Kegel exercises as directed by your health care provider. To do this: ? Squeeze your pelvic floor muscles tight. You should feel a tight lift in your rectal area and a tightness in your vaginal area. Keep your stomach, buttocks, and legs relaxed. ? Hold the muscles tight for up to 10 seconds. ? Relax your muscles. ? Repeat this exercise 50 times a day,   or as many times as told by your health care provider. Continue to do this exercise for at least 4-6 weeks, or for as long as told by your health care provider.  Take over-the-counter and  prescription medicines only as told by your health care provider.  If you have a pessary, take care of it as told by your health care provider.  Keep all follow-up visits as told by your health care provider. This is important. Contact a health care provider if you:  Have symptoms that interfere with your daily activities or sex life.  Need medicine to help with the discomfort.  Notice bleeding from your vagina that is not related to your period.  Have a fever.  Have pain or bleeding when you urinate.  Have bleeding when you pass stool.  Pass urine when you have sex.  Have chronic constipation.  Have a pessary that falls out.  Have bad smelling vaginal discharge.  Have an unusual, low pain in your abdomen. Summary  Pelvic organ prolapse is the stretching, bulging, or dropping of pelvic organs into an abnormal position. It happens when the muscles and tissues that surround and support pelvic structures become weak or stretched.  When organs other than the vagina are involved, they often bulge into the vagina or protrude from the vagina, depending on how severe the prolapse is.  In most cases, this condition needs to be treated only if it produces symptoms. Treatment may include lifestyle changes, estrogen, Kegel exercises, pessary insertion, or surgery.  Avoid heavy lifting and straining with exercise and work. Do not hold your breath when you perform mild to moderate lifting and exercise activities. Limit your activities as directed by your health care provider. This information is not intended to replace advice given to you by your health care provider. Make sure you discuss any questions you have with your health care provider. Document Revised: 07/15/2017 Document Reviewed: 07/15/2017 Elsevier Patient Education  2020 [employer].  

## 2019-07-31 LAB — URINE CULTURE: Organism ID, Bacteria: NO GROWTH

## 2019-08-04 ENCOUNTER — Encounter: Payer: Self-pay | Admitting: Family Medicine

## 2019-08-17 ENCOUNTER — Encounter: Payer: 59 | Admitting: Obstetrics and Gynecology

## 2019-08-30 ENCOUNTER — Other Ambulatory Visit: Payer: Self-pay | Admitting: General Surgery

## 2019-08-30 DIAGNOSIS — R1032 Left lower quadrant pain: Secondary | ICD-10-CM

## 2019-08-30 NOTE — Progress Notes (Signed)
ct 

## 2019-09-03 ENCOUNTER — Other Ambulatory Visit: Payer: Self-pay | Admitting: Family Medicine

## 2019-09-03 DIAGNOSIS — F32A Depression, unspecified: Secondary | ICD-10-CM

## 2019-09-03 DIAGNOSIS — F419 Anxiety disorder, unspecified: Secondary | ICD-10-CM

## 2019-09-03 DIAGNOSIS — F329 Major depressive disorder, single episode, unspecified: Secondary | ICD-10-CM

## 2019-09-04 NOTE — Telephone Encounter (Signed)
Requested Prescriptions  Pending Prescriptions Disp Refills  . DULoxetine (CYMBALTA) 30 MG capsule [Pharmacy Med Name: DULOXETINE DR 30MG  CAPSULES] 90 capsule 12    Sig: TAKE 3 CAPSULES(90 MG) BY MOUTH DAILY     Psychiatry: Antidepressants - SNRI Failed - 09/03/2019  3:01 PM      Failed - Completed PHQ-2 or PHQ-9 in the last 360 days.      Passed - Last BP in normal range    BP Readings from Last 1 Encounters:  04/06/19 132/72         Passed - Valid encounter within last 6 months    Recent Outpatient Visits          1 month ago Urinary frequency   [facility] [place], [place]   5 months ago Muscle spasm   [facility] [place]   1 year ago Annual physical exam   [facility] [place], [place]   1 year ago Neck pain   [facility] [place]   1 year ago Anxiety and depression   [facility] [place]

## 2019-09-09 ENCOUNTER — Ambulatory Visit
Admission: RE | Admit: 2019-09-09 | Discharge: 2019-09-09 | Disposition: A | Discharge status: Home / Self Care | Payer: 59 | Source: Ambulatory Visit | Attending: General Surgery | Admitting: General Surgery

## 2019-09-09 ENCOUNTER — Other Ambulatory Visit: Payer: Self-pay

## 2019-09-09 DIAGNOSIS — R1032 Left lower quadrant pain: Secondary | ICD-10-CM

## 2019-09-09 HISTORY — DX: Essential (primary) hypertension: I10

## 2019-09-09 LAB — POCT I-STAT CREATININE: Creatinine, Ser: 1.1 mg/dL — ABNORMAL HIGH (ref 0.44–1.00)

## 2019-09-09 MED ORDER — IOHEXOL 300 MG/ML  SOLN
100.0000 mL | Freq: Once | INTRAMUSCULAR | Status: AC | PRN
  Administered 2019-09-09: 100 mL via INTRAVENOUS

## 2019-09-10 ENCOUNTER — Encounter: Payer: Self-pay | Admitting: Family Medicine

## 2019-09-12 ENCOUNTER — Other Ambulatory Visit: Payer: Self-pay | Admitting: General Surgery

## 2019-09-12 ENCOUNTER — Telehealth: Payer: Self-pay

## 2019-09-12 DIAGNOSIS — F32A Depression, unspecified: Secondary | ICD-10-CM

## 2019-09-12 DIAGNOSIS — F329 Major depressive disorder, single episode, unspecified: Secondary | ICD-10-CM

## 2019-09-12 MED ORDER — DULOXETINE HCL 30 MG PO CPEP: ORAL_CAPSULE | ORAL | 0 refills | Status: DC

## 2019-09-12 NOTE — Telephone Encounter (Signed)
Medication sent to pharmacy  

## 2019-09-13 ENCOUNTER — Telehealth: Payer: Self-pay | Admitting: Obstetrics and Gynecology

## 2019-09-13 NOTE — Telephone Encounter (Signed)
Called patient to reschedule the appointment she had missed. I was unsuccessful reaching patient, and was not able to leave a message for patient to return my call.

## 2019-09-16 ENCOUNTER — Encounter: Payer: Self-pay | Admitting: *Deleted

## 2019-09-22 ENCOUNTER — Other Ambulatory Visit: Payer: Self-pay

## 2019-09-22 ENCOUNTER — Encounter: Payer: Self-pay | Admitting: General Surgery

## 2019-09-23 ENCOUNTER — Encounter: Payer: Self-pay | Admitting: Anesthesiology

## 2019-09-26 ENCOUNTER — Other Ambulatory Visit: Payer: 59

## 2019-09-27 ENCOUNTER — Other Ambulatory Visit
Admission: RE | Admit: 2019-09-27 | Discharge: 2019-09-27 | Disposition: A | Discharge status: Home / Self Care | Payer: 59 | Source: Ambulatory Visit | Attending: Surgery | Admitting: Surgery

## 2019-09-27 ENCOUNTER — Other Ambulatory Visit: Payer: Self-pay

## 2019-09-27 DIAGNOSIS — Z20822 Contact with and (suspected) exposure to covid-19: Secondary | ICD-10-CM | POA: Insufficient documentation

## 2019-09-27 DIAGNOSIS — Z01812 Encounter for preprocedural laboratory examination: Secondary | ICD-10-CM | POA: Diagnosis not present

## 2019-09-27 LAB — SARS CORONAVIRUS 2 (TAT 6-24 HRS): SARS Coronavirus 2: NEGATIVE

## 2019-09-28 NOTE — Discharge Instructions (Signed)
General Anesthesia, Adult, Care After This sheet gives you information about how to care for yourself after your procedure. Your health care provider may also give you more specific instructions. If you have problems or questions, contact your health care provider. What can I expect after the procedure? After the procedure, the following side effects are common:  Pain or discomfort at the IV site.  Nausea.  Vomiting.  Sore throat.  Trouble concentrating.  Feeling cold or chills.  Weak or tired.  Sleepiness and fatigue.  Soreness and body aches. These side effects can affect parts of the body that were not involved in surgery. Follow these instructions at home:  For at least 24 hours after the procedure:  Have a responsible adult stay with you. It is important to have someone help care for you until you are awake and alert.  Rest as needed.  Do not: ? Participate in activities in which you could fall or become injured. ? Drive. ? Use heavy machinery. ? Drink alcohol. ? Take sleeping pills or medicines that cause drowsiness. ? Make important decisions or sign legal documents. ? Take care of children on your own. Eating and drinking  Follow any instructions from your health care provider about eating or drinking restrictions.  When you feel hungry, start by eating small amounts of foods that are soft and easy to digest (bland), such as toast. Gradually return to your regular diet.  Drink enough fluid to keep your urine pale yellow.  If you vomit, rehydrate by drinking water, juice, or clear broth. General instructions  If you have sleep apnea, surgery and certain medicines can increase your risk for breathing problems. Follow instructions from your health care provider about wearing your sleep device: ? Anytime you are sleeping, including during daytime naps. ? While taking prescription pain medicines, sleeping medicines, or medicines that make you drowsy.  Return to  your normal activities as told by your health care provider. Ask your health care provider what activities are safe for you.  Take over-the-counter and prescription medicines only as told by your health care provider.  If you smoke, do not smoke without supervision.  Keep all follow-up visits as told by your health care provider. This is important. Contact a health care provider if:  You have nausea or vomiting that does not get better with medicine.  You cannot eat or drink without vomiting.  You have pain that does not get better with medicine.  You are unable to pass urine.  You develop a skin rash.  You have a fever.  You have redness around your IV site that gets worse. Get help right away if:  You have difficulty breathing.  You have chest pain.  You have blood in your urine or stool, or you vomit blood. Summary  After the procedure, it is common to have a sore throat or nausea. It is also common to feel tired.  Have a responsible adult stay with you for the first 24 hours after general anesthesia. It is important to have someone help care for you until you are awake and alert.  When you feel hungry, start by eating small amounts of foods that are soft and easy to digest (bland), such as toast. Gradually return to your regular diet.  Drink enough fluid to keep your urine pale yellow.  Return to your normal activities as told by your health care provider. Ask your health care provider what activities are safe for you. This information is not   intended to replace advice given to you by your health care provider. Make sure you discuss any questions you have with your health care provider. Document Revised: 06/26/2017 Document Reviewed: 02/06/2017 Elsevier Patient Education  2020 Elsevier Inc.  

## 2019-09-29 ENCOUNTER — Ambulatory Visit: Admission: RE | Admit: 2019-09-29 | Payer: 59 | Source: Home / Self Care | Admitting: General Surgery

## 2019-09-29 HISTORY — DX: Dizziness and giddiness: R42

## 2019-09-29 HISTORY — DX: Anxiety disorder, unspecified: F41.9

## 2019-09-29 HISTORY — DX: Hyperlipidemia, unspecified: E78.5

## 2019-09-29 HISTORY — DX: Gastro-esophageal reflux disease without esophagitis: K21.9

## 2019-09-29 HISTORY — DX: Unspecified osteoarthritis, unspecified site: M19.90

## 2019-09-29 HISTORY — DX: Irritable bowel syndrome without diarrhea: K58.9

## 2019-09-29 HISTORY — DX: Depression, unspecified: F32.A

## 2019-09-29 HISTORY — DX: Headache, unspecified: R51.9

## 2019-09-29 SURGERY — COLONOSCOPY WITH PROPOFOL
Anesthesia: General

## 2019-10-20 ENCOUNTER — Other Ambulatory Visit: Payer: Self-pay | Admitting: General Surgery

## 2019-10-20 DIAGNOSIS — A048 Other specified bacterial intestinal infections: Secondary | ICD-10-CM

## 2019-11-03 ENCOUNTER — Other Ambulatory Visit: Payer: Self-pay | Admitting: General Surgery

## 2019-11-03 DIAGNOSIS — A048 Other specified bacterial intestinal infections: Secondary | ICD-10-CM

## 2019-11-21 ENCOUNTER — Other Ambulatory Visit
Admission: RE | Admit: 2019-11-21 | Discharge: 2019-11-21 | Disposition: A | Discharge status: Home / Self Care | Payer: Self-pay | Source: Ambulatory Visit | Attending: General Surgery | Admitting: General Surgery

## 2019-11-21 DIAGNOSIS — A048 Other specified bacterial intestinal infections: Secondary | ICD-10-CM | POA: Insufficient documentation

## 2019-11-23 LAB — H. PYLORI ANTIGEN, STOOL: H. Pylori Stool Ag, Eia: NEGATIVE

## 2019-12-19 ENCOUNTER — Ambulatory Visit (INDEPENDENT_AMBULATORY_CARE_PROVIDER_SITE_OTHER)
Admission: RE | Admit: 2019-12-19 | Discharge: 2019-12-19 | Disposition: A | Discharge status: Home / Self Care | Payer: Self-pay | Source: Ambulatory Visit

## 2019-12-19 DIAGNOSIS — R059 Cough, unspecified: Secondary | ICD-10-CM

## 2019-12-19 DIAGNOSIS — R05 Cough: Secondary | ICD-10-CM

## 2019-12-19 DIAGNOSIS — J0100 Acute maxillary sinusitis, unspecified: Secondary | ICD-10-CM

## 2019-12-19 MED ORDER — BENZONATATE 100 MG PO CAPS: 100.0000 mg | ORAL_CAPSULE | Freq: Three times a day (TID) | ORAL | 0 refills | Status: DC | PRN

## 2019-12-19 MED ORDER — DOXYCYCLINE HYCLATE 100 MG PO CAPS: 100.0000 mg | ORAL_CAPSULE | Freq: Two times a day (BID) | ORAL | 0 refills | Status: DC

## 2019-12-19 NOTE — Discharge Instructions (Signed)
Take the doxycycline and Tessalon Perles as directed.    Follow up with your primary care provider or come here to be seen in person if your symptoms are not improving.

## 2019-12-19 NOTE — ED Provider Notes (Signed)
Virtual Visit via Video Note:  Danielle Walsh  initiated request for Telemedicine visit with [facility] team. I connected with Danielle Walsh  on 12/19/2019 at 1:54 PM  for a synchronized telemedicine visit using a video enabled HIPPA compliant telemedicine application. I verified that I am speaking with Danielle Walsh  using two identifiers. Mickie Bail, NP  was physically located in a [facility] Urgent care site and Danielle Walsh was located at a different location.   The limitations of evaluation and management by telemedicine as well as the availability of in-person appointments were discussed. Patient was informed that she  may incur a bill ( including co-pay) for this virtual visit encounter. Danielle Walsh  expressed understanding and gave verbal consent to proceed with virtual visit.     History of Present Illness:Danielle Walsh  is a 64 y.o. female presents for evaluation of 2 week history of postnasal drip, cough productive of thick green mucous, and nasal congestion.  Her cough is worse at night.  She denies fever, chills, sore throat, shortness of breath, vomiting, diarrhea, rash, or other symptoms.  Taking Zyrtec and Mucinex DM for symptoms.  She has received both COVID vaccines.    Allergies  Allergen Reactions  . Hydrocodone-Acetaminophen Itching  . Meperidine And Related Itching  . Penicillins Itching and Rash     Past Medical History:  Diagnosis Date  . Anxiety   . Arthritis    hands, knees  . Depression   . GERD (gastroesophageal reflux disease)   . Headache    1/momth migraine  . Hyperlipidemia   . Hypertension    controlled on meds  . IBS (irritable bowel syndrome)   . Vertigo    HX of     Social History   Tobacco Use  . Smoking status: Never Smoker  . Smokeless tobacco: Never Used  Substance Use Topics  . Alcohol use: Yes    Alcohol/week: 1.0 standard drink    Types: 1 Glasses of wine per week    Comment: occasionally  . Drug  use: Never    ROS: as stated in HPI.  All other systems reviewed and negative.       Observations/Objective: Physical Exam  VITALS: Patient denies fever. GENERAL: Alert, appears well and in no acute distress. HEENT: Atraumatic. Oral mucosa appears moist.  Nasal congestion noted.  NECK: Normal movements of the head and neck. CARDIOPULMONARY: No increased WOB. Speaking in clear sentences. I:E ratio WNL.  MS: Moves all visible extremities without noticeable abnormality. PSYCH: Pleasant and cooperative, well-groomed. Speech normal rate and rhythm. Affect is appropriate. Insight and judgement are appropriate. Attention is focused, linear, and appropriate.  NEURO: CN grossly intact. Oriented as arrived to appointment on time with no prompting. Moves both UE equally.  SKIN: No obvious lesions, wounds, erythema, or cyanosis noted on face or hands.   Assessment and Plan:    ICD-10-CM   1. Acute non-recurrent maxillary sinusitis  J01.00   2. Cough  R05        Follow Up Instructions: Treating with doxycycline and Tessalon Perles.  Instructed patient to follow-up with her PCP or come here to be seen in person if her symptoms are not improving.  Patient agrees to plan of care.    I discussed the assessment and treatment plan with the patient. The patient was provided an opportunity to ask questions and all were answered. The patient agreed with the plan and demonstrated  an understanding of the instructions.   The patient was advised to call back or seek an in-person evaluation if the symptoms worsen or if the condition fails to improve as anticipated.      Sharion Balloon, NP  12/19/2019 1:54 PM         Sharion Balloon, NP 12/19/19 1354

## 2019-12-22 NOTE — Progress Notes (Deleted)
Established patient visit   Patient: Danielle Walsh   DOB: 05-06-1956   64 y.o. Female  MRN: [id number] Visit Date: 12/26/2019  Today's healthcare provider: Wilhemena Durie, MD   No chief complaint on file.  Subjective    HPI  Hypertension, follow-up  BP Readings from Last 3 Encounters:  04/06/19 132/72  08/25/18 (!) 139/100  05/11/18 124/78   Wt Readings from Last 3 Encounters:  04/06/19 234 lb (106.1 kg)  08/25/18 227 lb 12.8 oz (103.3 kg)  05/11/18 231 lb (104.8 kg)     She was last seen for hypertension 9 months ago.  BP at that visit was 132/72. Management since that visit includes; On Cozaar-HCT. She reports {excellent/good/fair/poor:19665} compliance with treatment. She {is/is not:9024} having side effects. {document side effects if present:1} She {is/is not:9024} exercising. She {is/is not:9024} adherent to low salt diet.   Outside blood pressures are {enter patient reported home BP, or 'not being checked':1}.  She {does/does not:200015} smoke.  Use of agents associated with hypertension: {bp agents assoc with hypertension:511::"none"}.   --------------------------------------------------------------------------------------------------- Lipid/Cholesterol, follow-up  Last Lipid Panel: Lab Results  Component Value Date   CHOL 185 04/06/2019   LDLCALC 109 (H) 04/06/2019   HDL 58 04/06/2019   TRIG 100 04/06/2019    She was last seen for this 9 months ago.  Management since that visit includes; labs checked showing-normal.. She reports {excellent/good/fair/poor:19665} compliance with treatment. She {is/is not:9024} having side effects. {document side effects if present:1} She is following a {diet:21022986} diet. Current exercise: {exercise DJSHF:02637}  Last metabolic panel Lab Results  Component Value Date   GLUCOSE 102 (H) 04/06/2019   NA 141 04/06/2019   K 4.0 04/06/2019   BUN 14 04/06/2019   CREATININE 1.10 (H) 09/09/2019   GFRNONAA 45  (L) 04/06/2019   GFRAA 52 (L) 04/06/2019   CALCIUM 9.9 04/06/2019   AST 18 04/06/2019   ALT 13 04/06/2019   The 10-year ASCVD risk score Mikey Bussing DC Jr., et al., 2013) is: 6.8%  ---------------------------------------------------------------------------------------------------  Muscle spasm From 04/05/2020-Mainly due to cervical strain.   Anxiety and depression From 9/30./2021-On Cymbalta and alprazolam. Consider increasing Cymbalta in the future.  Hyperglycemia From 04/05/2020-Prediabetes. Hemoglobin A1c 5.8.  {Show patient history (optional):23778::" "}   Medications: Outpatient Medications Prior to Visit  Medication Sig  . acetaminophen (TYLENOL) 500 MG tablet Take 1 tablet (500 mg total) by mouth every 6 (six) hours as needed.  . ALPRAZolam (XANAX) 0.5 MG tablet TAKE 1 TABLET(0.5 MG) BY MOUTH TWICE DAILY AS NEEDED FOR ANXIETY  . atorvastatin (LIPITOR) 20 MG tablet TAKE 1 TABLET BY MOUTH EVERY DAY AT 6 PM  . benzonatate (TESSALON) 100 MG capsule Take 1 capsule (100 mg total) by mouth 3 (three) times daily as needed for cough.  . cyclobenzaprine (FLEXERIL) 10 MG tablet Take 1 tablet (10 mg total) by mouth 3 (three) times daily as needed. for muscle spams  . doxycycline (VIBRAMYCIN) 100 MG capsule Take 1 capsule (100 mg total) by mouth 2 (two) times daily.  . DULoxetine (CYMBALTA) 30 MG capsule TAKE 3 CAPSULES(90 MG) BY MOUTH DAILY (Patient taking differently: TAKE 3 CAPSULES(90 MG) BY MOUTH DAILY/ am)  . gentamicin (GARAMYCIN) 0.3 % ophthalmic solution Place 1 drop into both eyes every 4 (four) hours. (Patient not taking: Reported on 09/22/2019)  . hyoscyamine (LEVBID) 0.375 MG 12 hr tablet Take 1 tablet (0.375 mg total) by mouth every 12 (twelve) hours as needed.  Marland Kitchen losartan-hydrochlorothiazide (HYZAAR) 50-12.5 MG  tablet Take 1 tablet by mouth daily. (Patient taking differently: Take 1 tablet by mouth daily. am)  . meclizine (ANTIVERT) 25 MG tablet Take by mouth.  Marland Kitchen omeprazole  (PRILOSEC) 20 MG capsule TAKE 1 CAPSULE(20 MG) BY MOUTH DAILY (Patient taking differently: TAKE 1 CAPSULE(20 MG) BY MOUTH DAILY/ am)  . Omeprazole 20 MG TBEC Take 1 tablet (20 mg total) by mouth daily.  . temazepam (RESTORIL) 30 MG capsule TAKE 1 CAPSULE(30 MG) BY MOUTH AT BEDTIME (Patient taking differently: at bedtime as needed. TAKE 1 CAPSULE(30 MG) BY MOUTH AT BEDTIME)   No facility-administered medications prior to visit.    Review of Systems  Constitutional: Negative for appetite change, chills, fatigue and fever.  Respiratory: Negative for chest tightness and shortness of breath.   Cardiovascular: Negative for chest pain and palpitations.  Gastrointestinal: Negative for abdominal pain, nausea and vomiting.  Neurological: Negative for dizziness and weakness.    {Heme  Chem  Endocrine  Serology  Results Review (optional):23779::" "}  Objective    There were no vitals taken for this visit. {Show previous vital signs (optional):23777::" "}  Physical Exam  ***  No results found for any visits on 12/26/19.  Assessment & Plan     ***  No follow-ups on file.      {provider attestation***:1}   Megan Mans, MD  [facility] [phone] (phone) [phone] (fax)  [facility]

## 2019-12-26 ENCOUNTER — Ambulatory Visit: Payer: Self-pay | Admitting: Family Medicine

## 2019-12-30 NOTE — Progress Notes (Signed)
Established patient visit  I,April Miller,acting as a scribe for Wilhemena Durie, MD.,have documented all relevant documentation on the behalf of Wilhemena Durie, MD,as directed by  Wilhemena Durie, MD while in the presence of Wilhemena Durie, MD.   Patient: Danielle Walsh   DOB: 11-27-1955   64 y.o. Female  MRN: [id number] Visit Date: 01/02/2020  Today's healthcare provider: Wilhemena Durie, MD   Chief Complaint  Patient presents with  . Follow-up  . Hypertension  . Hyperlipidemia   Subjective    HPI  Hypertension, follow-up  BP Readings from Last 3 Encounters:  01/02/20 120/86  04/06/19 132/72  08/25/18 (!) 139/100   Wt Readings from Last 3 Encounters:  01/02/20 228 lb (103.4 kg)  04/06/19 234 lb (106.1 kg)  08/25/18 227 lb 12.8 oz (103.3 kg)     She was last seen for hypertension 9 months ago.  BP at that visit was 132/72. Management since that visit includes; controled on Hyzaar 50/12.5mg  once daily. She reports good compliance with treatment. She is not having side effects. none She is exercising. She is adherent to low salt diet.   Outside blood pressures are 130/80.  She does not smoke.  Use of agents associated with hypertension: none.   --------------------------------------------------------------------  Lipid/Cholesterol, follow-up  Last Lipid Panel: Lab Results  Component Value Date   CHOL 185 04/06/2019   LDLCALC 109 (H) 04/06/2019   HDL 58 04/06/2019   TRIG 100 04/06/2019    She was last seen for this 9 months ago.  Management since that visit includes; labs checked showing-normal. She reports good compliance with treatment. She is not having side effects. none She is following a Regular diet. Current exercise: walking  Last metabolic panel Lab Results  Component Value Date   GLUCOSE 102 (H) 04/06/2019   NA 141 04/06/2019   K 4.0 04/06/2019   BUN 14 04/06/2019   CREATININE 1.10 (H) 09/09/2019   GFRNONAA 45  (L) 04/06/2019   GFRAA 52 (L) 04/06/2019   CALCIUM 9.9 04/06/2019   AST 18 04/06/2019   ALT 13 04/06/2019   The 10-year ASCVD risk score Mikey Bussing DC Jr., et al., 2013) is: 4.9%  --------------------------------------------------------------------      Medications: Outpatient Medications Prior to Visit  Medication Sig  . acetaminophen (TYLENOL) 500 MG tablet Take 1 tablet (500 mg total) by mouth every 6 (six) hours as needed.  . ALPRAZolam (XANAX) 0.5 MG tablet TAKE 1 TABLET(0.5 MG) BY MOUTH TWICE DAILY AS NEEDED FOR ANXIETY  . atorvastatin (LIPITOR) 20 MG tablet TAKE 1 TABLET BY MOUTH EVERY DAY AT 6 PM  . cyclobenzaprine (FLEXERIL) 10 MG tablet Take 1 tablet (10 mg total) by mouth 3 (three) times daily as needed. for muscle spams  . DULoxetine (CYMBALTA) 30 MG capsule TAKE 3 CAPSULES(90 MG) BY MOUTH DAILY (Patient taking differently: TAKE 3 CAPSULES(90 MG) BY MOUTH DAILY/ am)  . hyoscyamine (LEVBID) 0.375 MG 12 hr tablet Take 1 tablet (0.375 mg total) by mouth every 12 (twelve) hours as needed.  Marland Kitchen losartan-hydrochlorothiazide (HYZAAR) 50-12.5 MG tablet Take 1 tablet by mouth daily. (Patient taking differently: Take 1 tablet by mouth daily. am)  . meclizine (ANTIVERT) 25 MG tablet Take by mouth.  Marland Kitchen omeprazole (PRILOSEC) 20 MG capsule TAKE 1 CAPSULE(20 MG) BY MOUTH DAILY (Patient taking differently: TAKE 1 CAPSULE(20 MG) BY MOUTH DAILY/ am)  . temazepam (RESTORIL) 30 MG capsule TAKE 1 CAPSULE(30 MG) BY MOUTH AT BEDTIME (Patient  taking differently: at bedtime as needed. TAKE 1 CAPSULE(30 MG) BY MOUTH AT BEDTIME)  . benzonatate (TESSALON) 100 MG capsule Take 1 capsule (100 mg total) by mouth 3 (three) times daily as needed for cough. (Patient not taking: Reported on 01/02/2020)  . doxycycline (VIBRAMYCIN) 100 MG capsule Take 1 capsule (100 mg total) by mouth 2 (two) times daily. (Patient not taking: Reported on 01/02/2020)  . gentamicin (GARAMYCIN) 0.3 % ophthalmic solution Place 1 drop into both  eyes every 4 (four) hours. (Patient not taking: Reported on 09/22/2019)  . Omeprazole 20 MG TBEC Take 1 tablet (20 mg total) by mouth daily. (Patient not taking: Reported on 01/02/2020)   No facility-administered medications prior to visit.    Review of Systems  Constitutional: Negative for appetite change, chills, fatigue and fever.  Respiratory: Negative for chest tightness and shortness of breath.   Cardiovascular: Negative for chest pain and palpitations.  Gastrointestinal: Negative for abdominal pain, nausea and vomiting.  Neurological: Negative for dizziness and weakness.      Objective    BP 120/86 (BP Location: Right Arm, Patient Position: Sitting, Cuff Size: Large)   Pulse 88   Temp (!) 97.5 F (36.4 C) (Other (Comment))   Resp 16   Ht 5\' 8"  (1.727 m)   Wt 228 lb (103.4 kg)   SpO2 97%   BMI 34.67 kg/m    Physical Exam Vitals reviewed.  Constitutional:      Appearance: She is well-developed.  HENT:     Head: Normocephalic and atraumatic.     Right Ear: External ear normal.     Left Ear: External ear normal.     Nose: Nose normal.  Eyes:     General: No scleral icterus.    Conjunctiva/sclera: Conjunctivae normal.  Neck:     Thyroid: No thyromegaly.  Cardiovascular:     Rate and Rhythm: Normal rate and regular rhythm.     Heart sounds: Normal heart sounds.  Pulmonary:     Effort: Pulmonary effort is normal.     Breath sounds: Normal breath sounds.  Abdominal:     Palpations: Abdomen is soft.  Skin:    General: Skin is warm and dry.  Neurological:     General: No focal deficit present.     Mental Status: She is alert and oriented to person, place, and time.  Psychiatric:        Mood and Affect: Mood normal.        Behavior: Behavior normal.        Thought Content: Thought content normal.        Judgment: Judgment normal.       No results found for any visits on 01/02/20.  Assessment & Plan     1. Essential (primary) hypertension Good control on  losartan HCT - Renal function panel - losartan-hydrochlorothiazide (HYZAAR) 50-12.5 MG tablet; Take 1 tablet by mouth daily.  Dispense: 30 tablet; Refill: 12  2. Other hyperlipidemia  - atorvastatin (LIPITOR) 20 MG tablet; TAKE 1 TABLET BY MOUTH EVERY DAY AT 6 PM  Dispense: 30 tablet; Refill: 12  3. Chronic kidney disease, unspecified CKD stage - Renal function panel  4. Anxiety and depression Tinea present medications.  Now that she is retired try to cut back on benzodiazepines. - ALPRAZolam (XANAX) 0.5 MG tablet; TAKE 1 TABLET(0.5 MG) BY MOUTH TWICE DAILY AS NEEDED FOR ANXIETY  Dispense: 60 tablet; Refill: 3 - DULoxetine (CYMBALTA) 30 MG capsule; TAKE 3 CAPSULES(90 MG) BY MOUTH DAILY  Dispense: 30 capsule; Refill: 0 - temazepam (RESTORIL) 30 MG capsule; TAKE 1 CAPSULE(30 MG) BY MOUTH AT BEDTIME  Dispense: 30 capsule; Refill: 3  5. Muscle spasm  - cyclobenzaprine (FLEXERIL) 10 MG tablet; Take 1 tablet (10 mg total) by mouth 3 (three) times daily as needed. for muscle spams  Dispense: 90 tablet; Refill: 0  6. Gastro-esophageal reflux disease without esophagitis Continue PPI for the time being. - omeprazole (PRILOSEC) 20 MG capsule; TAKE 1 CAPSULE(20 MG) BY MOUTH DAILY  Dispense: 30 capsule; Refill: 12   No follow-ups on file.      I, Megan Mans, MD, have reviewed all documentation for this visit. The documentation on 01/07/20 for the exam, diagnosis, procedures, and orders are all accurate and complete.    Trevionne Advani Wendelyn Breslow, MD  [facility] [phone] (phone) [phone] (fax)  [facility]

## 2020-01-02 ENCOUNTER — Other Ambulatory Visit: Payer: Self-pay

## 2020-01-02 ENCOUNTER — Encounter: Payer: Self-pay | Admitting: Family Medicine

## 2020-01-02 ENCOUNTER — Ambulatory Visit (INDEPENDENT_AMBULATORY_CARE_PROVIDER_SITE_OTHER): Payer: Self-pay | Admitting: Family Medicine

## 2020-01-02 VITALS — BP 120/86 | HR 88 | Temp 97.5°F | Resp 16 | Ht 68.0 in | Wt 228.0 lb

## 2020-01-02 DIAGNOSIS — E7849 Other hyperlipidemia: Secondary | ICD-10-CM

## 2020-01-02 DIAGNOSIS — M62838 Other muscle spasm: Secondary | ICD-10-CM

## 2020-01-02 DIAGNOSIS — F329 Major depressive disorder, single episode, unspecified: Secondary | ICD-10-CM

## 2020-01-02 DIAGNOSIS — K219 Gastro-esophageal reflux disease without esophagitis: Secondary | ICD-10-CM

## 2020-01-02 DIAGNOSIS — N189 Chronic kidney disease, unspecified: Secondary | ICD-10-CM

## 2020-01-02 DIAGNOSIS — F419 Anxiety disorder, unspecified: Secondary | ICD-10-CM

## 2020-01-02 DIAGNOSIS — F32A Depression, unspecified: Secondary | ICD-10-CM

## 2020-01-02 DIAGNOSIS — I10 Essential (primary) hypertension: Secondary | ICD-10-CM

## 2020-01-03 LAB — RENAL FUNCTION PANEL
Albumin: 4.3 g/dL (ref 3.8–4.8)
BUN/Creatinine Ratio: 16 (ref 12–28)
BUN: 16 mg/dL (ref 8–27)
CO2: 24 mmol/L (ref 20–29)
Calcium: 9.2 mg/dL (ref 8.7–10.3)
Chloride: 103 mmol/L (ref 96–106)
Creatinine, Ser: 0.99 mg/dL (ref 0.57–1.00)
GFR calc Af Amer: 70 mL/min/{1.73_m2} (ref >59)
GFR calc non Af Amer: 61 mL/min/{1.73_m2} (ref >59)
Glucose: 102 mg/dL — ABNORMAL HIGH (ref 65–99)
Phosphorus: 2.6 mg/dL — ABNORMAL LOW (ref 3.0–4.3)
Potassium: 3.4 mmol/L — ABNORMAL LOW (ref 3.5–5.2)
Sodium: 142 mmol/L (ref 134–144)

## 2020-01-07 MED ORDER — HYOSCYAMINE SULFATE ER 0.375 MG PO TB12: 0.3750 mg | ORAL_TABLET | Freq: Two times a day (BID) | ORAL | 12 refills | Status: AC | PRN

## 2020-01-07 MED ORDER — CYCLOBENZAPRINE HCL 10 MG PO TABS: 10.0000 mg | ORAL_TABLET | Freq: Three times a day (TID) | ORAL | 0 refills | Status: DC | PRN

## 2020-01-07 MED ORDER — TEMAZEPAM 30 MG PO CAPS: ORAL_CAPSULE | ORAL | 3 refills | Status: DC

## 2020-01-07 MED ORDER — ATORVASTATIN CALCIUM 20 MG PO TABS: ORAL_TABLET | ORAL | 12 refills | Status: DC

## 2020-01-07 MED ORDER — LOSARTAN POTASSIUM-HCTZ 50-12.5 MG PO TABS: 1.0000 | ORAL_TABLET | Freq: Every day | ORAL | 12 refills | Status: DC

## 2020-01-07 MED ORDER — OMEPRAZOLE 20 MG PO CPDR: DELAYED_RELEASE_CAPSULE | ORAL | 12 refills | Status: DC

## 2020-01-07 MED ORDER — ALPRAZOLAM 0.5 MG PO TABS: ORAL_TABLET | ORAL | 3 refills | Status: DC

## 2020-01-07 MED ORDER — DULOXETINE HCL 30 MG PO CPEP: ORAL_CAPSULE | ORAL | 0 refills | Status: DC

## 2020-01-12 ENCOUNTER — Encounter: Payer: Self-pay | Admitting: Family Medicine

## 2020-01-30 ENCOUNTER — Other Ambulatory Visit: Payer: Self-pay | Admitting: Family Medicine

## 2020-01-30 DIAGNOSIS — F32A Depression, unspecified: Secondary | ICD-10-CM

## 2020-01-30 NOTE — Telephone Encounter (Signed)
90 day fill with 1 RF given  Requested Prescriptions  Pending Prescriptions Disp Refills  . DULoxetine (CYMBALTA) 30 MG capsule [Pharmacy Med Name: DULoxetine HCL DR 30 MG CAPSULE] 270 capsule 1    Sig: TAKE THREE CAPSULES BY MOUTH DAILY     Psychiatry: Antidepressants - SNRI Failed - 01/30/2020 12:13 PM      Failed - Completed PHQ-2 or PHQ-9 in the last 360 days.      Passed - Last BP in normal range    BP Readings from Last 1 Encounters:  01/02/20 120/86         Passed - Valid encounter within last 6 months    Recent Outpatient Visits          4 weeks ago Essential (primary) hypertension   [facility] Maple Hudson., MD   6 months ago Urinary frequency   [facility] Osvaldo Angst [place], [place]   9 months ago Muscle spasm   [facility] Maple Hudson., MD   1 year ago Annual physical exam   [facility] Joycelyn Man [place], [place]   1 year ago Neck pain   [facility] Maple Hudson., MD      Future Appointments            In 5 months Maple Hudson., MD [facility]

## 2020-02-08 ENCOUNTER — Telehealth: Payer: Self-pay

## 2020-02-08 DIAGNOSIS — F32A Depression, unspecified: Secondary | ICD-10-CM

## 2020-02-08 NOTE — Telephone Encounter (Signed)
Medication has already been refilled for patient.

## 2020-05-01 ENCOUNTER — Telehealth: Payer: Self-pay | Admitting: Physician Assistant

## 2020-05-01 DIAGNOSIS — J069 Acute upper respiratory infection, unspecified: Secondary | ICD-10-CM

## 2020-05-01 MED ORDER — FLUTICASONE PROPIONATE 50 MCG/ACT NA SUSP: 2.0000 | Freq: Every day | NASAL | 0 refills | Status: DC

## 2020-05-01 MED ORDER — BENZONATATE 100 MG PO CAPS: 100.0000 mg | ORAL_CAPSULE | Freq: Three times a day (TID) | ORAL | 0 refills | Status: DC | PRN

## 2020-05-01 NOTE — Progress Notes (Signed)
We are sorry you are not feeling well.  Here is how we plan to help!  Based on what you have shared with me, it looks like you may have a viral upper respiratory infection.  At this time I do not believe you need an antibiotic. Upper respiratory infections are caused by a large number of viruses; however, rhinovirus is the most common cause.   Symptoms vary from person to person, with common symptoms including sore throat, cough, fatigue or lack of energy and feeling of general discomfort.  A low-grade fever of up to 100.4 may present, but is often uncommon.  Symptoms vary however, and are closely related to a person's age or underlying illnesses.  The most common symptoms associated with an upper respiratory infection are nasal discharge or congestion, cough, sneezing, headache and pressure in the ears and face.  These symptoms usually persist for about 3 to 10 days, but can last up to 2 weeks.  It is important to know that upper respiratory infections do not cause serious illness or complications in most cases.    Upper respiratory infections can be transmitted from person to person, with the most common method of transmission being a person's hands.  The virus is able to live on the skin and can infect other persons for up to 2 hours after direct contact.  Also, these can be transmitted when someone coughs or sneezes; thus, it is important to cover the mouth to reduce this risk.  To keep the spread of the illness at bay, good hand hygiene is very important.  This is an infection that is most likely caused by a virus. There are no specific treatments other than to help you with the symptoms until the infection runs its course.  We are sorry you are not feeling well.  Here is how we plan to help!   For nasal congestion, you may use an oral decongestants such as Mucinex D or if you have glaucoma or high blood pressure use plain Mucinex.  Saline nasal spray or nasal drops can help and can safely be used as  often as needed for congestion.  For your congestion, I have prescribed Fluticasone nasal spray one spray in each nostril twice a day  If you do not have a history of heart disease, hypertension, diabetes or thyroid disease, prostate/bladder issues or glaucoma, you may also use Sudafed to treat nasal congestion.  It is highly recommended that you consult with a pharmacist or your primary care physician to ensure this medication is safe for you to take.     If you have a cough, you may use cough suppressants such as Delsym and Robitussin.  If you have glaucoma or high blood pressure, you can also use Coricidin HBP.   For cough I have prescribed for you A prescription cough medication called Tessalon Perles 100 mg. You may take 1-2 capsules every 8 hours as needed for cough  If you have a sore or scratchy throat, use a saltwater gargle-  to  teaspoon of salt dissolved in a 4-ounce to 8-ounce glass of warm water.  Gargle the solution for approximately 15-30 seconds and then spit.  It is important not to swallow the solution.  You can also use throat lozenges/cough drops and Chloraseptic spray to help with throat pain or discomfort.  Warm or cold liquids can also be helpful in relieving throat pain.  For headache, pain or general discomfort, you can use Ibuprofen or Tylenol as directed.  Some authorities believe that zinc sprays or the use of Echinacea may shorten the course of your symptoms.   HOME CARE . Only take medications as instructed by your medical team. . Be sure to drink plenty of fluids. Water is fine as well as fruit juices, sodas and electrolyte beverages. You may want to stay away from caffeine or alcohol. If you are nauseated, try taking small sips of liquids. How do you know if you are getting enough fluid? Your urine should be a pale yellow or almost colorless. . Get rest. . Taking a steamy shower or using a humidifier may help nasal congestion and ease sore throat pain. You can  place a towel over your head and breathe in the steam from hot water coming from a faucet. . Using a saline nasal spray works much the same way. . Cough drops, hard candies and sore throat lozenges may ease your cough. . Avoid close contacts especially the very young and the elderly . Cover your mouth if you cough or sneeze . Always remember to wash your hands.   GET HELP RIGHT AWAY IF: . You develop worsening fever. . If your symptoms do not improve within 10 days . You develop yellow or green discharge from your nose over 3 days. . You have coughing fits . You develop a severe head ache or visual changes. . You develop shortness of breath, difficulty breathing or start having chest pain . Your symptoms persist after you have completed your treatment plan  MAKE SURE YOU   Understand these instructions.  Will watch your condition.  Will get help right away if you are not doing well or get worse.  Your e-visit answers were reviewed by a board certified advanced clinical practitioner to complete your personal care plan. Depending upon the condition, your plan could have included both over the counter or prescription medications. Please review your pharmacy choice. If there is a problem, you may call our nursing hot line at and have the prescription routed to another pharmacy. Your safety is important to [place]. If you have drug allergies check your prescription carefully.   You can use MyChart to ask questions about today's visit, request a non-urgent call back, or ask for a work or school excuse for 24 hours related to this e-Visit. If it has been greater than 24 hours you will need to follow up with your provider, or enter a new e-Visit to address those concerns. You will get an e-mail in the next two days asking about your experience.  I hope that your e-visit has been valuable and will speed your recovery. Thank you for using e-visits.     Greater than 5 minutes, yet less than 10  minutes of time have been spent researching, coordinating, and implementing care for this patient today

## 2020-05-03 ENCOUNTER — Other Ambulatory Visit: Payer: Self-pay

## 2020-05-03 ENCOUNTER — Encounter: Payer: Self-pay | Admitting: Family Medicine

## 2020-05-03 MED ORDER — AZITHROMYCIN 250 MG PO TABS: ORAL_TABLET | ORAL | 0 refills | Status: DC

## 2020-06-26 ENCOUNTER — Ambulatory Visit: Payer: Self-pay | Admitting: Family Medicine

## 2020-07-03 ENCOUNTER — Encounter: Payer: Self-pay | Admitting: Family Medicine

## 2020-07-05 ENCOUNTER — Ambulatory Visit: Payer: Self-pay | Admitting: *Deleted

## 2020-07-05 DIAGNOSIS — U071 COVID-19: Secondary | ICD-10-CM

## 2020-07-05 MED ORDER — BENZONATATE 200 MG PO CAPS: 200.0000 mg | ORAL_CAPSULE | Freq: Three times a day (TID) | ORAL | 0 refills | Status: DC | PRN

## 2020-07-05 MED ORDER — BUDESONIDE 90 MCG/ACT IN AEPB: 1.0000 | INHALATION_SPRAY | Freq: Two times a day (BID) | RESPIRATORY_TRACT | 0 refills | Status: DC

## 2020-07-05 MED ORDER — ALBUTEROL SULFATE HFA 108 (90 BASE) MCG/ACT IN AERS: 2.0000 | INHALATION_SPRAY | Freq: Four times a day (QID) | RESPIRATORY_TRACT | 0 refills | Status: DC | PRN

## 2020-07-05 NOTE — Telephone Encounter (Signed)
I do not prescribe ivermectin as there has been no scientific evidence at this time that it is effective.   I will send in symptomatic treatment with an inhaled corticosteroid inhaler and albuterol inhaler. I can also send in some tessalon perles for cough suppression if needed.  As for the monoclonal antibody treatment, I can place a referral, but [facility] infusions have been very limited and they are having to prioritize patients at highest risk. Unfortunately, this has meant unvaccinated patients have been given priority as they are highest risk for hospitalization. They can call around as [facility] and [facility] have [facility] infusion clinics as well. Would they like for me to still put the "ask" in for ours? 

## 2020-07-05 NOTE — Telephone Encounter (Signed)
  Her husband Dr. Juanetta Gosling called in.   He and his wife (pt) are positive for covid. Wife is high risk due to age, hypertension and over weight.  He is requesting Ivermectin for the both of them.  I have sent a high priority message to [facility] for Dr. Sullivan Lone.  See notes below.  Reason for Disposition . HIGH RISK for severe COVID complications (e.g., age > 64 years, obesity with BMI > 25, pregnant, chronic lung disease or other chronic medical condition)  (Exception: Already seen by PCP and no new or worsening symptoms.)    Pt's husband is a doctor and they are both positive for covid 19.   He is requesting Ivermectin for both of them.  Answer Assessment - Initial Assessment Questions 1. COVID-19 DIAGNOSIS: "Who made your COVID-19 diagnosis?" "Was it confirmed by a positive lab test?" If not diagnosed by a HCP, ask "Are there lots of cases (community spread) where you live?" Note: See public health department website, if unsure.     Symptoms started 12/24.   She tested at [facility] also. 2. COVID-19 EXPOSURE: "Was there any known exposure to COVID before the symptoms began?" CDC Definition of close contact: within 6 feet (2 meters) for a total of 15 minutes or more over a 24-hour period.      Don't know about exposures. 3. ONSET: "When did the COVID-19 symptoms start?"      12/24 4. WORST SYMPTOM: "What is your worst symptom?" (e.g., cough, fever, shortness of breath, muscle aches)     She is coughing, congestion, body aches, fever, fatigue. 5. COUGH: "Do you have a cough?" If Yes, ask: "How bad is the cough?"       Yes   Dry cough 6. FEVER: "Do you have a fever?" If Yes, ask: "What is your temperature, how was it measured, and when did it start?"     No fever 7. RESPIRATORY STATUS: "Describe your breathing?" (e.g., shortness of breath, wheezing, unable to speak)      No shortness of breath or chest pain 8. BETTER-SAME-WORSE: "Are you getting better, staying the same  or getting worse compared to yesterday?"  If getting worse, ask, "In what way?"     Same 9. HIGH RISK DISEASE: "Do you have any chronic medical problems?" (e.g., asthma, heart or lung disease, weak immune system, obesity, etc.)     64 yrs old, hypertension and over weight, elevated cholesterol is risk factors. 10. VACCINE: "Have you gotten the COVID-19 vaccine?" If Yes ask: "Which one, how many shots, when did you get it?"       Yes to both covid vaccines and the booster. 11. PREGNANCY: "Is there any chance you are pregnant?" "When was your last menstrual period?"       Not asked due to age. 12. OTHER SYMPTOMS: "Do you have any other symptoms?"  (e.g., chills, fatigue, headache, loss of smell or taste, muscle pain, sore throat; new loss of smell or taste especially support the diagnosis of COVID-19)       See above  Protocols used: CORONAVIRUS (COVID-19) DIAGNOSED OR SUSPECTED-A-AH

## 2020-07-05 NOTE — Telephone Encounter (Signed)
Patient advised and referral placed.  

## 2020-07-05 NOTE — Addendum Note (Signed)
Addended by: Margaretann Loveless on: 07/05/2020 12:35 PM   Modules accepted: Orders

## 2020-07-11 ENCOUNTER — Ambulatory Visit: Payer: Self-pay | Admitting: Family Medicine

## 2020-09-05 ENCOUNTER — Encounter: Payer: Self-pay | Admitting: Family Medicine

## 2020-09-10 ENCOUNTER — Encounter: Payer: Self-pay | Admitting: Family Medicine

## 2020-09-10 ENCOUNTER — Ambulatory Visit (INDEPENDENT_AMBULATORY_CARE_PROVIDER_SITE_OTHER): Payer: Self-pay | Admitting: Family Medicine

## 2020-09-10 ENCOUNTER — Other Ambulatory Visit: Payer: Self-pay

## 2020-09-10 VITALS — BP 144/88 | HR 84 | Temp 98.9°F | Resp 16 | Wt 230.0 lb

## 2020-09-10 DIAGNOSIS — E7849 Other hyperlipidemia: Secondary | ICD-10-CM

## 2020-09-10 DIAGNOSIS — I10 Essential (primary) hypertension: Secondary | ICD-10-CM

## 2020-09-10 DIAGNOSIS — F32A Depression, unspecified: Secondary | ICD-10-CM

## 2020-09-10 DIAGNOSIS — F419 Anxiety disorder, unspecified: Secondary | ICD-10-CM

## 2020-09-10 DIAGNOSIS — K219 Gastro-esophageal reflux disease without esophagitis: Secondary | ICD-10-CM

## 2020-09-10 NOTE — Progress Notes (Signed)
Established patient visit   Patient: Danielle Walsh   DOB: 05/08/1956   65 y.o. Female  MRN: [id number] Visit Date: 09/10/2020  Today's healthcare provider: Megan Mans, MD   Chief Complaint  Patient presents with  . Hypertension   Subjective    HPI  Patient is now retired and is fully enjoying being retired and being a grandmother. Blood pressures are running 1 25-1 30 over 80s.  She has no complaints today. Hypertension, follow-up  BP Readings from Last 3 Encounters:  09/10/20 (!) 144/88  01/02/20 120/86  04/06/19 132/72   Wt Readings from Last 3 Encounters:  09/10/20 230 lb (104.3 kg)  01/02/20 228 lb (103.4 kg)  04/06/19 234 lb (106.1 kg)     She was last seen for hypertension 9 months ago.  BP at that visit was 120/86. Management since that visit includes no medication changes.  She reports good compliance with treatment. She is not having side effects.  She is following a Regular diet. She is not exercising. She does not smoke.  Use of agents associated with hypertension: none.   Outside blood pressures are checked occasionally. Symptoms: No chest pain No chest pressure  No palpitations No syncope  No dyspnea No orthopnea  No paroxysmal nocturnal dyspnea No lower extremity edema   Pertinent labs: Lab Results  Component Value Date   CHOL 185 04/06/2019   HDL 58 04/06/2019   LDLCALC 109 (H) 04/06/2019   TRIG 100 04/06/2019   CHOLHDL 3.2 04/06/2019   Lab Results  Component Value Date   NA 142 01/02/2020   K 3.4 (L) 01/02/2020   CREATININE 0.99 01/02/2020   GFRNONAA 61 01/02/2020   GFRAA 70 01/02/2020   GLUCOSE 102 (H) 01/02/2020     The 10-year ASCVD risk score Denman George DC Jr., et al., 2013) is: 7.1%   Lipid/Cholesterol, Follow-up  Last lipid panel Other pertinent labs  Lab Results  Component Value Date   CHOL 185 04/06/2019   HDL 58 04/06/2019   LDLCALC 109 (H) 04/06/2019   TRIG 100 04/06/2019   CHOLHDL 3.2 04/06/2019   Lab  Results  Component Value Date   ALT 13 04/06/2019   AST 18 04/06/2019   PLT 173 04/06/2019   TSH 3.140 04/06/2019     She was last seen for this 9 months ago.  Management since that visit includes no medication changes.  She reports good compliance with treatment. She is not having side effects.   Anxiety, Follow-up  She was last seen for anxiety 9 months ago. Changes made at last visit include no medication changes. However, patient was to cut back on use of alprazolam daily.    She reports good compliance with treatment. She reports good tolerance of treatment. She is not having side effects.   She feels her anxiety is mild and Unchanged since last visit.  Symptoms: No chest pain No difficulty concentrating  No dizziness No fatigue  No feelings of losing control No insomnia  No irritable No palpitations  No panic attacks No racing thoughts  No shortness of breath No sweating  No tremors/shakes    GAD-7 Results No flowsheet data found.  PHQ-9 Scores PHQ9 SCORE ONLY 09/10/2020 08/25/2018 04/13/2017  PHQ-9 Total Score 4 6 2     ---------------------------------------------------------------------------------------------------       Medications: Outpatient Medications Prior to Visit  Medication Sig  . acetaminophen (TYLENOL) 500 MG tablet Take 1 tablet (500 mg total) by mouth every 6 (  six) hours as needed.  Marland Kitchen albuterol (VENTOLIN HFA) 108 (90 Base) MCG/ACT inhaler Inhale 2 puffs into the lungs every 6 (six) hours as needed for wheezing or shortness of breath.  . ALPRAZolam (XANAX) 0.5 MG tablet TAKE 1 TABLET(0.5 MG) BY MOUTH TWICE DAILY AS NEEDED FOR ANXIETY  . atorvastatin (LIPITOR) 20 MG tablet TAKE 1 TABLET BY MOUTH EVERY DAY AT 6 PM  . Budesonide 90 MCG/ACT inhaler Inhale 1 puff into the lungs 2 (two) times daily.  . cyclobenzaprine (FLEXERIL) 10 MG tablet Take 1 tablet (10 mg total) by mouth 3 (three) times daily as needed. for muscle spams  . DULoxetine  (CYMBALTA) 30 MG capsule TAKE THREE CAPSULES BY MOUTH DAILY  . fluticasone (FLONASE) 50 MCG/ACT nasal spray Place 2 sprays into both nostrils daily.  . hyoscyamine (LEVBID) 0.375 MG 12 hr tablet Take 1 tablet (0.375 mg total) by mouth every 12 (twelve) hours as needed.  Marland Kitchen losartan-hydrochlorothiazide (HYZAAR) 50-12.5 MG tablet Take 1 tablet by mouth daily.  . meclizine (ANTIVERT) 25 MG tablet Take by mouth.  Marland Kitchen omeprazole (PRILOSEC) 20 MG capsule TAKE 1 CAPSULE(20 MG) BY MOUTH DAILY  . temazepam (RESTORIL) 30 MG capsule TAKE 1 CAPSULE(30 MG) BY MOUTH AT BEDTIME  . azithromycin (ZITHROMAX) 250 MG tablet 2 PO on day 1 then 1 daily  . benzonatate (TESSALON) 200 MG capsule Take 1 capsule (200 mg total) by mouth 3 (three) times daily as needed. (Patient not taking: Reported on 09/10/2020)  . Omeprazole 20 MG TBEC Take 1 tablet (20 mg total) by mouth daily. (Patient not taking: No sig reported)   No facility-administered medications prior to visit.    Review of Systems  Constitutional: Negative.   Respiratory: Negative for cough and shortness of breath.   Cardiovascular: Negative for chest pain, palpitations and leg swelling.  Musculoskeletal: Negative for arthralgias and myalgias.  Neurological: Negative for dizziness, light-headedness and headaches.  Psychiatric/Behavioral: Negative for agitation, self-injury, sleep disturbance and suicidal ideas. The patient is not nervous/anxious.         Objective    BP (!) 144/88   Pulse 84   Temp 98.9 F (37.2 C)   Resp 16   Wt 230 lb (104.3 kg)   BMI 34.97 kg/m  BP Readings from Last 3 Encounters:  09/10/20 (!) 144/88  01/02/20 120/86  04/06/19 132/72   Wt Readings from Last 3 Encounters:  09/10/20 230 lb (104.3 kg)  01/02/20 228 lb (103.4 kg)  04/06/19 234 lb (106.1 kg)       Physical Exam Vitals reviewed.  Constitutional:      Appearance: She is well-developed.  HENT:     Head: Normocephalic and atraumatic.     Right Ear: External  ear normal.     Left Ear: External ear normal.     Nose: Nose normal.  Eyes:     General: No scleral icterus.    Conjunctiva/sclera: Conjunctivae normal.  Neck:     Thyroid: No thyromegaly.  Cardiovascular:     Rate and Rhythm: Normal rate and regular rhythm.     Heart sounds: Normal heart sounds.  Pulmonary:     Effort: Pulmonary effort is normal.     Breath sounds: Normal breath sounds.  Abdominal:     Palpations: Abdomen is soft.  Skin:    General: Skin is warm and dry.  Neurological:     General: No focal deficit present.     Mental Status: She is alert and oriented to person, place,  and time.  Psychiatric:        Mood and Affect: Mood normal.        Behavior: Behavior normal.        Thought Content: Thought content normal.        Judgment: Judgment normal.       No results found for any visits on 09/10/20.  Assessment & Plan     1. Essential (primary) hypertension Good control.  She is always had a whitecoat hypertension component - Comprehensive metabolic panel  2. Other hyperlipidemia On atorvastatin - Lipid panel  3. Gastro-esophageal reflux disease without esophagitis On omeprazole daily and as needed - CBC with Differential/Platelet - TSH  4. Anxiety and depression Clinically stable on duloxetine.  Consider cutting back or stopping this later in the year.   No follow-ups on file.      I, Megan Mans, MD, have reviewed all documentation for this visit. The documentation on 09/11/20 for the exam, diagnosis, procedures, and orders are all accurate and complete.    Lacoya Wilbanks Wendelyn Breslow, MD  [facility] [phone] (phone) [phone] (fax)  [facility]

## 2020-09-14 LAB — COMPREHENSIVE METABOLIC PANEL
ALT: 8 IU/L (ref 0–32)
AST: 14 IU/L (ref 0–40)
Albumin/Globulin Ratio: 1.8 (ref 1.2–2.2)
Albumin: 4.6 g/dL (ref 3.8–4.8)
Alkaline Phosphatase: 116 IU/L (ref 44–121)
BUN/Creatinine Ratio: 14 (ref 12–28)
BUN: 14 mg/dL (ref 8–27)
Bilirubin Total: 0.7 mg/dL (ref 0.0–1.2)
CO2: 23 mmol/L (ref 20–29)
Calcium: 9.7 mg/dL (ref 8.7–10.3)
Chloride: 101 mmol/L (ref 96–106)
Creatinine, Ser: 1 mg/dL (ref 0.57–1.00)
Globulin, Total: 2.5 g/dL (ref 1.5–4.5)
Glucose: 103 mg/dL — ABNORMAL HIGH (ref 65–99)
Potassium: 4 mmol/L (ref 3.5–5.2)
Sodium: 140 mmol/L (ref 134–144)
Total Protein: 7.1 g/dL (ref 6.0–8.5)
eGFR: 63 mL/min/{1.73_m2} (ref >59)

## 2020-09-14 LAB — CBC WITH DIFFERENTIAL/PLATELET
Basophils Absolute: 0.1 10*3/uL (ref 0.0–0.2)
Basos: 1 %
EOS (ABSOLUTE): 0.1 10*3/uL (ref 0.0–0.4)
Eos: 2 %
Hematocrit: 41.6 % (ref 34.0–46.6)
Hemoglobin: 13.4 g/dL (ref 11.1–15.9)
Immature Grans (Abs): 0 10*3/uL (ref 0.0–0.1)
Immature Granulocytes: 0 %
Lymphocytes Absolute: 2 10*3/uL (ref 0.7–3.1)
Lymphs: 25 %
MCH: 29.4 pg (ref 26.6–33.0)
MCHC: 32.2 g/dL (ref 31.5–35.7)
MCV: 91 fL (ref 79–97)
Monocytes Absolute: 0.4 10*3/uL (ref 0.1–0.9)
Monocytes: 5 %
Neutrophils Absolute: 5.2 10*3/uL (ref 1.4–7.0)
Neutrophils: 67 %
Platelets: 191 10*3/uL (ref 150–450)
RBC: 4.56 x10E6/uL (ref 3.77–5.28)
RDW: 11.9 % (ref 11.7–15.4)
WBC: 7.8 10*3/uL (ref 3.4–10.8)

## 2020-09-14 LAB — LIPID PANEL
Chol/HDL Ratio: 4.1 ratio (ref 0.0–4.4)
Cholesterol, Total: 234 mg/dL — ABNORMAL HIGH (ref 100–199)
HDL: 57 mg/dL (ref >39)
LDL Chol Calc (NIH): 152 mg/dL — ABNORMAL HIGH (ref 0–99)
Triglycerides: 138 mg/dL (ref 0–149)
VLDL Cholesterol Cal: 25 mg/dL (ref 5–40)

## 2020-09-14 LAB — TSH: TSH: 3.5 u[IU]/mL (ref 0.450–4.500)

## 2020-09-19 ENCOUNTER — Other Ambulatory Visit: Payer: Self-pay | Admitting: Family Medicine

## 2020-09-19 DIAGNOSIS — F32A Depression, unspecified: Secondary | ICD-10-CM

## 2020-09-19 NOTE — Telephone Encounter (Signed)
Requested medication (s) are due for refill today: Yes  Requested medication (s) are on the active medication list: Yes  Last refill:  01/02/20  Future visit scheduled: Yes  Notes to clinic:  Unable to refill per protocol, cannot delegate.      Requested Prescriptions  Pending Prescriptions Disp Refills   temazepam (RESTORIL) 30 MG capsule [Pharmacy Med Name: TEMAZEPAM 30 MG CAPSULE] 30 capsule     Sig: TAKE ONE CAPSULE BY MOUTH AT BEDTIME      Not Delegated - Psychiatry:  Anxiolytics/Hypnotics Failed - 09/19/2020 12:11 PM      Failed - This refill cannot be delegated      Failed - Urine Drug Screen completed in last 360 days      Passed - Valid encounter within last 6 months    Recent Outpatient Visits           1 week ago Essential (primary) hypertension   [facility] Maple Hudson., MD   8 months ago Essential (primary) hypertension   [facility] Maple Hudson., MD   1 year ago Urinary frequency   [facility] [place], [place]   1 year ago Muscle spasm   [facility] Maple Hudson., MD   2 years ago Annual physical exam   [facility] [place], [place], [place]       Future Appointments             In 5 months Maple Hudson., MD [facility]               ALPRAZolam Prudy Feeler) 0.5 MG tablet [Pharmacy Med Name: ALPRAZolam 0.5 MG TABLET] 60 tablet     Sig: TAKE ONE TABLET BY MOUTH TWICE A DAY AS NEEDED FOR ANXIETY      Not Delegated - Psychiatry:  Anxiolytics/Hypnotics Failed - 09/19/2020 12:11 PM      Failed - This refill cannot be delegated      Failed - Urine Drug Screen completed in last 360 days      Passed - Valid encounter within last 6 months    Recent Outpatient Visits           1 week ago Essential (primary) hypertension   [facility] Maple Hudson., MD   8 months ago Essential (primary)  hypertension   [facility] Maple Hudson., MD   1 year ago Urinary frequency   [facility] [place], [place]   1 year ago Muscle spasm   [facility] Maple Hudson., MD   2 years ago Annual physical exam   [facility] [place], [place], [place]       Future Appointments             In 5 months Maple Hudson., MD [facility]              Signed Prescriptions Disp Refills   DULoxetine (CYMBALTA) 30 MG capsule 270 capsule 1    Sig: TAKE THREE CAPSULES BY MOUTH DAILY      Psychiatry: Antidepressants - SNRI Failed - 09/19/2020 12:11 PM      Failed - Last BP in normal range    BP Readings from Last 1 Encounters:  09/10/20 (!) 144/88          Passed - Completed PHQ-2 or PHQ-9 in the last 360 days  Passed - Valid encounter within last 6 months    Recent Outpatient Visits           1 week ago Essential (primary) hypertension   [facility] Maple Hudson., MD   8 months ago Essential (primary) hypertension   [facility] Maple Hudson., MD   1 year ago Urinary frequency   [facility] [place], [place]   1 year ago Muscle spasm   [facility] Maple Hudson., MD   2 years ago Annual physical exam   [facility] [place], [place], [place]       Future Appointments             In 5 months Maple Hudson., MD [facility]

## 2021-01-08 ENCOUNTER — Telehealth: Payer: Self-pay

## 2021-01-08 MED ORDER — AZITHROMYCIN 250 MG PO TABS: ORAL_TABLET | ORAL | 0 refills | Status: AC

## 2021-01-08 NOTE — Telephone Encounter (Signed)
Medication sent in. Patient advised.  

## 2021-01-08 NOTE — Telephone Encounter (Signed)
He reports that patient has productive cough, sinus pressure behind cheek bones, headache, and feels it is going to her chest. Denies fever. She has had symptoms X 10 days. He reports that this has been recurrent since April. She will get well then develop symptoms again. This is the longest she has had symptoms. She has used OTC flonase and Mucinex DM. He is requesting that an abx be sent into [facility]. Please advise. Thanks!

## 2021-01-08 NOTE — Telephone Encounter (Signed)
Copied from CRM [id number]. Topic: General - Inquiry >> Jan 08, 2021  9:58 AM Daphine Deutscher D wrote: Reason for CRM: Dr. Juanetta Gosling called on behalf of his wife Danielle Walsh stating she has a bad sinus infection.  He as requested that an antibiotic be called in to  [facility]  He would like a call back also from a nurse.  [phone]

## 2021-01-09 ENCOUNTER — Telehealth: Payer: Self-pay

## 2021-01-09 DIAGNOSIS — U071 COVID-19: Secondary | ICD-10-CM

## 2021-01-09 NOTE — Telephone Encounter (Signed)
Ok to send in? Please advise. Thanks!  

## 2021-01-09 NOTE — Telephone Encounter (Signed)
Copied from CRM [id number]. Topic: General - Other >> Jan 09, 2021  9:05 AM Gaetana Michaelis A wrote: Reason for CRM: Patient's spouse has called to request that patient be prescribed Tessalon PERLEs  The patient's cough continues to worsen even after taking the antibiotics they have been prescribed  Please contact to further advise if needed

## 2021-01-10 MED ORDER — BENZONATATE 200 MG PO CAPS: 200.0000 mg | ORAL_CAPSULE | Freq: Three times a day (TID) | ORAL | 2 refills | Status: DC | PRN

## 2021-01-10 NOTE — Telephone Encounter (Signed)
Medication sent into the pharmacy. 

## 2021-01-23 ENCOUNTER — Other Ambulatory Visit: Payer: Self-pay | Admitting: Family Medicine

## 2021-01-23 DIAGNOSIS — I10 Essential (primary) hypertension: Secondary | ICD-10-CM

## 2021-01-23 DIAGNOSIS — M62838 Other muscle spasm: Secondary | ICD-10-CM

## 2021-01-23 NOTE — Telephone Encounter (Signed)
Please review. Ok to refill?  

## 2021-02-06 ENCOUNTER — Other Ambulatory Visit: Payer: Self-pay | Admitting: Family Medicine

## 2021-02-06 DIAGNOSIS — K219 Gastro-esophageal reflux disease without esophagitis: Secondary | ICD-10-CM

## 2021-02-06 NOTE — Telephone Encounter (Signed)
Requested medications are due for refill today.  yes  Requested medications are on the active medications list.  yes  Last refill. 01/07/2020  Future visit scheduled.   yes  Notes to clinic.  Prescription is expired.

## 2021-03-13 ENCOUNTER — Ambulatory Visit: Payer: Self-pay | Admitting: Family Medicine

## 2021-03-21 ENCOUNTER — Other Ambulatory Visit: Payer: Self-pay

## 2021-03-21 ENCOUNTER — Encounter: Payer: Self-pay | Admitting: Family Medicine

## 2021-03-21 ENCOUNTER — Ambulatory Visit (INDEPENDENT_AMBULATORY_CARE_PROVIDER_SITE_OTHER): Payer: Self-pay | Admitting: Family Medicine

## 2021-03-21 VITALS — BP 135/86 | HR 81 | Temp 98.1°F | Resp 16 | Ht 67.0 in | Wt 228.0 lb

## 2021-03-21 DIAGNOSIS — F419 Anxiety disorder, unspecified: Secondary | ICD-10-CM

## 2021-03-21 DIAGNOSIS — F32A Depression, unspecified: Secondary | ICD-10-CM

## 2021-03-21 DIAGNOSIS — E7800 Pure hypercholesterolemia, unspecified: Secondary | ICD-10-CM

## 2021-03-21 DIAGNOSIS — I10 Essential (primary) hypertension: Secondary | ICD-10-CM

## 2021-03-21 DIAGNOSIS — Z23 Encounter for immunization: Secondary | ICD-10-CM

## 2021-03-21 NOTE — Progress Notes (Signed)
Established patient visit   Patient: Danielle Walsh   DOB: 04-Aug-1955   65 y.o. Female  MRN: [id number] Visit Date: 03/21/2021  Today's healthcare provider: Megan Mans, MD   Chief Complaint  Patient presents with   Hypertension   Depression   Subjective    HPI  Patient comes in today for follow-up.  She is having a situational stress as her daughter is pregnant with a baby with a chromosomal abnormality which is major.  Her daughter is decided not to have an abortion and the stress of this is weighing on the patient. Hypertension, follow-up  BP Readings from Last 3 Encounters:  03/21/21 135/86  09/10/20 (!) 144/88  01/02/20 120/86   Wt Readings from Last 3 Encounters:  03/21/21 228 lb (103.4 kg)  09/10/20 230 lb (104.3 kg)  01/02/20 228 lb (103.4 kg)     She was last seen for hypertension 6 months ago.  BP at that visit was 144/88. Management since that visit includes; Good control.  She is always had a whitecoat hypertension component. She reports good compliance with treatment. She is not having side effects.  She is not exercising. She is adherent to low salt diet.   Outside blood pressures are checked occasionally.  She does not smoke.   Lipid/Cholesterol, follow-up  Last Lipid Panel: Lab Results  Component Value Date   CHOL 234 (H) 09/13/2020   LDLCALC 152 (H) 09/13/2020   HDL 57 09/13/2020   TRIG 138 09/13/2020    She was last seen for this 6 months ago.  Management since that visit includes; labs checked showing-normal range but cholesterol higher. Advised to work on diet and exercise.  She reports good compliance with treatment. She is not having side effects.   She is following a Regular diet. Current exercise: none  Last metabolic panel Lab Results  Component Value Date   GLUCOSE 103 (H) 09/13/2020   NA 140 09/13/2020   K 4.0 09/13/2020   BUN 14 09/13/2020   CREATININE 1.00 09/13/2020   GFRNONAA 61 01/02/2020   GFRAA 70  01/02/2020   CALCIUM 9.7 09/13/2020   AST 14 09/13/2020   ALT 8 09/13/2020   The 10-year ASCVD risk score (Arnett DK, et al., 2019) is: 8%   Follow up for Anxiety and depression:  The patient was last seen for this 6 months ago. Changes made at last visit include; Clinically stable on duloxetine.  Consider cutting back or stopping this later in the year.  She reports good compliance with treatment. She feels that condition is Unchanged. She is not having side effects.      Medications: Outpatient Medications Prior to Visit  Medication Sig   acetaminophen (TYLENOL) 500 MG tablet Take 1 tablet (500 mg total) by mouth every 6 (six) hours as needed.   ALPRAZolam (XANAX) 0.5 MG tablet TAKE ONE TABLET BY MOUTH TWICE A DAY AS NEEDED FOR ANXIETY   atorvastatin (LIPITOR) 20 MG tablet TAKE 1 TABLET BY MOUTH EVERY DAY AT 6 PM   cyclobenzaprine (FLEXERIL) 10 MG tablet TAKE ONE TABLET BY MOUTH THREE TIMES A DAY AS NEEDED FOR MUSCLE SPASMS   DULoxetine (CYMBALTA) 30 MG capsule TAKE THREE CAPSULES BY MOUTH DAILY   hyoscyamine (LEVBID) 0.375 MG 12 hr tablet Take 1 tablet (0.375 mg total) by mouth every 12 (twelve) hours as needed.   losartan-hydrochlorothiazide (HYZAAR) 50-12.5 MG tablet TAKE ONE TABLET BY MOUTH DAILY   meclizine (ANTIVERT) 25 MG tablet Take  by mouth.   omeprazole (PRILOSEC) 20 MG capsule TAKE ONE CAPSULE BY MOUTH DAILY   temazepam (RESTORIL) 30 MG capsule TAKE ONE CAPSULE BY MOUTH AT BEDTIME   albuterol (VENTOLIN HFA) 108 (90 Base) MCG/ACT inhaler Inhale 2 puffs into the lungs every 6 (six) hours as needed for wheezing or shortness of breath.   benzonatate (TESSALON) 200 MG capsule Take 1 capsule (200 mg total) by mouth 3 (three) times daily as needed.   Budesonide 90 MCG/ACT inhaler Inhale 1 puff into the lungs 2 (two) times daily.   fluticasone (FLONASE) 50 MCG/ACT nasal spray Place 2 sprays into both nostrils daily.   Omeprazole 20 MG TBEC Take 1 tablet (20 mg total) by  mouth daily. (Patient not taking: No sig reported)   No facility-administered medications prior to visit.    Review of Systems  Constitutional:  Negative for appetite change, chills, fatigue and fever.  Respiratory:  Negative for chest tightness and shortness of breath.   Cardiovascular:  Negative for chest pain and palpitations.  Gastrointestinal:  Negative for abdominal pain, nausea and vomiting.  Neurological:  Negative for dizziness and weakness.       Objective    BP 135/86   Pulse 81   Temp 98.1 F (36.7 C)   Resp 16   Ht 5\' 7"  (1.702 m)   Wt 228 lb (103.4 kg)   BMI 35.71 kg/m  BP Readings from Last 3 Encounters:  03/21/21 135/86  09/10/20 (!) 144/88  01/02/20 120/86   Wt Readings from Last 3 Encounters:  03/21/21 228 lb (103.4 kg)  09/10/20 230 lb (104.3 kg)  01/02/20 228 lb (103.4 kg)      Physical Exam Vitals reviewed.  Constitutional:      Appearance: She is well-developed.  HENT:     Head: Normocephalic and atraumatic.     Right Ear: External ear normal.     Left Ear: External ear normal.     Nose: Nose normal.  Eyes:     General: No scleral icterus.    Conjunctiva/sclera: Conjunctivae normal.  Neck:     Thyroid: No thyromegaly.  Cardiovascular:     Rate and Rhythm: Normal rate and regular rhythm.     Heart sounds: Normal heart sounds.  Pulmonary:     Effort: Pulmonary effort is normal.     Breath sounds: Normal breath sounds.  Abdominal:     Palpations: Abdomen is soft.  Skin:    General: Skin is warm and dry.  Neurological:     General: No focal deficit present.     Mental Status: She is alert and oriented to person, place, and time.  Psychiatric:        Mood and Affect: Mood normal.        Behavior: Behavior normal.        Thought Content: Thought content normal.        Judgment: Judgment normal.     Comments: Patient is tearful but very appropriate during the exam      No results found for any visits on 03/21/21.  Assessment &  Plan    1. Essential (primary) hypertension Good control on losartan HCT  2. Pure hypercholesterolemia Treated with atorvastatin 20  3. Anxiety and depression Situational stress.  I have recommended patient call [facility] counseling, she is also talking to her pastor.    No follow-ups on file.      I, 03/23/21, MD, have reviewed all documentation for this visit. The  documentation on 03/25/21 for the exam, diagnosis, procedures, and orders are all accurate and complete.    Analynn Daum Wendelyn Breslow, MD  [facility] [phone] (phone) [phone] (fax)  [facility]

## 2021-04-10 ENCOUNTER — Other Ambulatory Visit: Payer: Self-pay | Admitting: Family Medicine

## 2021-04-10 DIAGNOSIS — E7849 Other hyperlipidemia: Secondary | ICD-10-CM

## 2021-04-10 DIAGNOSIS — F32A Depression, unspecified: Secondary | ICD-10-CM

## 2021-04-10 DIAGNOSIS — F419 Anxiety disorder, unspecified: Secondary | ICD-10-CM

## 2021-04-10 NOTE — Telephone Encounter (Signed)
Requested medications are due for refill today.  yes  Requested medications are on the active medications list.  yes  Last refill. Both were refilled 09/21/2020  Future visit scheduled.   yes  Notes to clinic.  Medications are not delegated.

## 2021-08-13 ENCOUNTER — Other Ambulatory Visit: Payer: Self-pay | Admitting: Family Medicine

## 2021-08-13 DIAGNOSIS — F419 Anxiety disorder, unspecified: Secondary | ICD-10-CM

## 2021-08-13 DIAGNOSIS — F32A Depression, unspecified: Secondary | ICD-10-CM

## 2021-08-14 NOTE — Telephone Encounter (Signed)
Requested Prescriptions  Pending Prescriptions Disp Refills   DULoxetine (CYMBALTA) 30 MG capsule [Pharmacy Med Name: DULoxetine HCL DR 30 MG CAPSULE] 270 capsule 0    Sig: TAKE THREE CAPSULES BY MOUTH DAILY     Psychiatry: Antidepressants - SNRI - duloxetine Passed - 08/13/2021  6:50 PM      Passed - Cr in normal range and within 360 days    Creat  Date Value Ref Range Status  04/23/2017 1.02 (H) 0.50 - 0.99 mg/dL Final    Comment:    For patients >65 years of age, the reference limit for Creatinine is approximately 13% higher for people identified as African-American. .    Creatinine, Ser  Date Value Ref Range Status  09/13/2020 1.00 0.57 - 1.00 mg/dL Final         Passed - eGFR is 30 or above and within 360 days    GFR, Est African American  Date Value Ref Range Status  04/23/2017 69 > OR = 60 mL/min/1.49m Final   GFR calc Af Amer  Date Value Ref Range Status  01/02/2020 70 >59 mL/min/1.73 Final    Comment:    **[facility] currently reports eGFR in compliance with the current**   recommendations of the [employer] [facility] will   update reporting as new guidelines are published from the NKF-ASN   Task force.    GFR, Est Non African American  Date Value Ref Range Status  04/23/2017 60 > OR = 60 mL/min/1.765mFinal   GFR calc non Af Amer  Date Value Ref Range Status  01/02/2020 61 >59 mL/min/1.73 Final   eGFR  Date Value Ref Range Status  09/13/2020 63 >59 mL/min/1.73 Final         Passed - Completed PHQ-2 or PHQ-9 in the last 360 days      Passed - Last BP in normal range    BP Readings from Last 1 Encounters:  03/21/21 135/86         Passed - Valid encounter within last 6 months    Recent Outpatient Visits          4 months ago [facility]primary) [facility]   11 months ago [facility]primary) [facility]   [facility]   1 year ago  Essential (primary) hypertension   [facility]   2 years ago Urinary frequency   [facility] [facility] Muscle spasm   [facility]      Future Appointments            In 1 month [facility]

## 2021-09-02 ENCOUNTER — Other Ambulatory Visit: Payer: Self-pay

## 2021-09-02 ENCOUNTER — Ambulatory Visit (INDEPENDENT_AMBULATORY_CARE_PROVIDER_SITE_OTHER): Payer: Self-pay | Admitting: Family Medicine

## 2021-09-02 VITALS — BP 126/94 | HR 97 | Temp 98.0°F | Wt 226.0 lb

## 2021-09-02 DIAGNOSIS — J020 Streptococcal pharyngitis: Secondary | ICD-10-CM

## 2021-09-02 DIAGNOSIS — J029 Acute pharyngitis, unspecified: Secondary | ICD-10-CM

## 2021-09-02 LAB — POCT RAPID STREP A (OFFICE): Rapid Strep A Screen: POSITIVE — AB

## 2021-09-02 MED ORDER — AZITHROMYCIN 250 MG PO TABS: ORAL_TABLET | ORAL | 0 refills | Status: AC

## 2021-09-02 NOTE — Progress Notes (Signed)
Acute Office Visit  Subjective:    Patient ID: Danielle Walsh, female    DOB: [birth year], 66 y.o.   MRN: [id number]  No chief complaint on file.   HPI Patient is in today for evaluation of sore throat.  Patient states she woke yesterday not feeling well and had temperature of 100 last night.  Today she has sore throat not just with swallowing.  She has voice change and feels bad.  She kept one of her grandchildren 3 days ago and that child got sick and tested positive for strep.  Past Medical History:  Diagnosis Date   Anxiety    Arthritis    hands, knees   Depression    GERD (gastroesophageal reflux disease)    Headache    1/momth migraine   Hyperlipidemia    Hypertension    controlled on meds   IBS (irritable bowel syndrome)    Vertigo    HX of    Past Surgical History:  Procedure Laterality Date   KNEE ARTHROSCOPY Left 2012   meniscus repair   LEEP  1998   OTHER SURGICAL HISTORY     thermal ablasion-uterine area due to abnormal bleeding   ROTATOR CUFF REPAIR     TUBAL LIGATION  1995    Family History  Problem Relation Age of Onset   Hypertension Mother    Arthritis Mother    Diabetes Brother    Hyperlipidemia Brother    Alzheimer's disease Father     Social History   Socioeconomic History   Marital status: Married    Spouse name: Not on file   Number of children: Not on file   Years of education: Not on file   Highest education level: Not on file  Occupational History   Not on file  Tobacco Use   Smoking status: Never   Smokeless tobacco: Never  Substance and Sexual Activity   Alcohol use: Yes    Alcohol/week: 1.0 standard drink    Types: 1 Glasses of wine per week    Comment: occasionally   Drug use: Never   Sexual activity: Not on file  Other Topics Concern   Not on file  Social History Narrative   Not on file   Social Determinants of Health   Financial Resource Strain: Not on file  Food Insecurity: Not on file  Transportation  Needs: Not on file  Physical Activity: Not on file  Stress: Not on file  Social Connections: Not on file  Intimate Partner Violence: Not on file    Outpatient Medications Prior to Visit  Medication Sig Dispense Refill   acetaminophen (TYLENOL) 500 MG tablet Take 1 tablet (500 mg total) by mouth every 6 (six) hours as needed. 30 tablet 0   ALPRAZolam (XANAX) 0.5 MG tablet TAKE ONE TABLET BY MOUTH TWICE A DAY AS NEEDED FOR ANXIETY. 60 tablet 3   atorvastatin (LIPITOR) 20 MG tablet TAKE ONE TABLET BY MOUTH DAILY AT 6 P.M. 30 tablet 11   cyclobenzaprine (FLEXERIL) 10 MG tablet TAKE ONE TABLET BY MOUTH THREE TIMES A DAY AS NEEDED FOR MUSCLE SPASMS 90 tablet 3   DULoxetine (CYMBALTA) 30 MG capsule TAKE THREE CAPSULES BY MOUTH DAILY 270 capsule 0   hyoscyamine (LEVBID) 0.375 MG 12 hr tablet Take 1 tablet (0.375 mg total) by mouth every 12 (twelve) hours as needed. 60 tablet 12   losartan-hydrochlorothiazide (HYZAAR) 50-12.5 MG tablet TAKE ONE TABLET BY MOUTH DAILY 30 tablet 12   meclizine (ANTIVERT)  MG tablet Take by mouth.    ° omeprazole (PRILOSEC) 20 MG capsule TAKE ONE CAPSULE BY MOUTH DAILY 90 capsule 3  ° temazepam (RESTORIL) 30 MG capsule TAKE 1 CAPSULE BY MOUTH AT BEDTIME. 30 capsule 3  ° albuterol (VENTOLIN HFA) 108 (90 Base) MCG/ACT inhaler Inhale 2 puffs into the lungs every 6 (six) hours as needed for wheezing or shortness of breath. 18 g 0  ° benzonatate (TESSALON) 200 MG capsule Take 1 capsule (200 mg total) by mouth 3 (three) times daily as needed. 30 capsule 2  ° Budesonide 90 MCG/ACT inhaler Inhale 1 puff into the lungs 2 (two) times daily. 1 each 0  ° fluticasone (FLONASE) 50 MCG/ACT nasal spray Place 2 sprays into both nostrils daily. 9.9 g 0  ° Omeprazole 20 MG TBEC Take 1 tablet (20 mg total) by mouth daily. (Patient not taking: No sig reported) 90 each 3  ° °No facility-administered medications prior to visit.  ° ° °Allergies  °Allergen Reactions  ° Hydrocodone-Acetaminophen Itching   ° Meperidine And Related Itching  ° Penicillins Itching and Rash  ° ° °Review of Systems  °Constitutional:  Positive for chills, fatigue and fever. Negative for diaphoresis.  °HENT:  Positive for congestion, postnasal drip, sinus pressure, sore throat, trouble swallowing and voice change. Negative for ear discharge, ear pain, rhinorrhea, sinus pain and sneezing.   °Respiratory:  Positive for cough.   °Cardiovascular:  Negative for chest pain.  ° °   °Objective:  °  °Physical Exam °Vitals reviewed.  °Constitutional:   °   Appearance: She is well-developed.  °HENT:  °   Head: Normocephalic and atraumatic.  °   Right Ear: External ear normal.  °   Left Ear: External ear normal.  °   Nose: Nose normal.  °   Mouth/Throat:  °   Mouth: Mucous membranes are moist.  °   Pharynx: Posterior oropharyngeal erythema present. No oropharyngeal exudate.  °Eyes:  °   General: No scleral icterus. °   Conjunctiva/sclera: Conjunctivae normal.  °Neck:  °   Thyroid: No thyromegaly.  °Cardiovascular:  °   Rate and Rhythm: Normal rate and regular rhythm.  °   Heart sounds: Normal heart sounds.  °Pulmonary:  °   Effort: Pulmonary effort is normal.  °   Breath sounds: Normal breath sounds.  °Abdominal:  °   Palpations: Abdomen is soft.  °Skin: °   General: Skin is warm and dry.  °Neurological:  °   General: No focal deficit present.  °   Mental Status: She is alert and oriented to person, place, and time.  °Psychiatric:     °   Mood and Affect: Mood normal.     °   Behavior: Behavior normal.     °   Thought Content: Thought content normal.     °   Judgment: Judgment normal.  ° ° °BP (!) 126/94 (BP Location: Right Arm, Patient Position: Sitting, Cuff Size: Large)    Pulse 97    Temp 98 °F (36.7 °C) (Oral)    Wt 226 lb (102.5 kg)    SpO2 98%    BMI 35.40 kg/m²  °Wt Readings from Last 3 Encounters:  °09/02/21 226 lb (102.5 kg)  °03/21/21 228 lb (103.4 kg)  °09/10/20 230 lb (104.3 kg)  ° °Vitals:  ° 09/02/21 1412 09/02/21 1416  °BP: (!) 137/108  (!) 126/94  °Pulse: 97   °Temp: 98 °F (36.7 °C)   °TempSrc: Oral   °  Oral   SpO2: 98%   Weight: 226 lb (102.5 kg)     Health Maintenance Due  Topic Date Due   COVID-19 Vaccine (1) Never done   COLONOSCOPY (Pts 45-34yr Insurance coverage will need to be confirmed)  Never done   Zoster Vaccines- Shingrix (1 of 2) Never done   MAMMOGRAM  09/18/2017   PAP SMEAR-Modifier  08/25/2021    There are no preventive care reminders to display for this patient.   Lab Results  Component Value Date   TSH 3.500 09/13/2020   Lab Results  Component Value Date   WBC 7.8 09/13/2020   HGB 13.4 09/13/2020   HCT 41.6 09/13/2020   MCV 91 09/13/2020   PLT 191 09/13/2020   Lab Results  Component Value Date   NA 140 09/13/2020   K 4.0 09/13/2020   CO2 23 09/13/2020   GLUCOSE 103 (H) 09/13/2020   BUN 14 09/13/2020   CREATININE 1.00 09/13/2020   BILITOT 0.7 09/13/2020   ALKPHOS 116 09/13/2020   AST 14 09/13/2020   ALT 8 09/13/2020   PROT 7.1 09/13/2020   ALBUMIN 4.6 09/13/2020   CALCIUM 9.7 09/13/2020   EGFR 63 09/13/2020   Lab Results  Component Value Date   CHOL 234 (H) 09/13/2020   Lab Results  Component Value Date   HDL 57 09/13/2020   Lab Results  Component Value Date   LDLCALC 152 (H) 09/13/2020   Lab Results  Component Value Date   TRIG 138 09/13/2020   Lab Results  Component Value Date   CHOLHDL 4.1 09/13/2020   Lab Results  Component Value Date   HGBA1C 5.8 (H) 04/06/2019       Assessment & Plan:   Problem List Items Addressed This Visit   None Visit Diagnoses     Sore throat    -  Primary   Relevant Orders   POCT rapid strep A (Completed)     1. Sore throat Strep is positive - POCT rapid strep A--Positive  2. Strep throat And penicillin allergic patient will treat with Z-Pak.  She states she had a significant rash with penicillin     I,Elena D DeSanto,acting as a scribe for RWilhemena Durie MD.,have documented all relevant documentation on the  behalf of RWilhemena Durie MD,as directed by  RWilhemena Durie MD while in the presence of RWilhemena Durie MD. I have done the exam and reviewed the chart and it is accurate to the best of my knowledge. DDevelopment worker, communityhas been used and  any errors in dictation or transcription are unintentional. RMiguel AschoffM.D. BK[facility]ville[facility]

## 2021-09-05 ENCOUNTER — Other Ambulatory Visit: Payer: Self-pay

## 2021-09-05 ENCOUNTER — Ambulatory Visit: Payer: Self-pay | Admitting: *Deleted

## 2021-09-05 ENCOUNTER — Encounter: Payer: Self-pay | Admitting: Family Medicine

## 2021-09-05 MED ORDER — NYSTATIN 100000 UNIT/ML MT SUSP: 5.0000 mL | Freq: Three times a day (TID) | OROMUCOSAL | 1 refills | Status: DC

## 2021-09-05 NOTE — Telephone Encounter (Signed)
Call received from [facility] pharmacy to verify components of magic mouthwash (nystatin 60 ml, lidocaine 60 ml, and diphenhydramine 60 ml) suspension. [facility] pharmacy was sent refill request from [facility] and need verification.  ?

## 2021-09-05 NOTE — Progress Notes (Signed)
[facility]

## 2021-09-18 ENCOUNTER — Ambulatory Visit: Payer: Self-pay | Admitting: Family Medicine

## 2021-09-26 ENCOUNTER — Ambulatory Visit
Admission: RE | Admit: 2021-09-26 | Discharge: 2021-09-26 | Disposition: A | Discharge status: Home / Self Care | Payer: Self-pay | Attending: Family Medicine | Admitting: Family Medicine

## 2021-09-26 ENCOUNTER — Ambulatory Visit
Admission: RE | Admit: 2021-09-26 | Discharge: 2021-09-26 | Disposition: A | Discharge status: Home / Self Care | Payer: Self-pay | Source: Ambulatory Visit | Attending: Family Medicine | Admitting: Family Medicine

## 2021-09-26 ENCOUNTER — Ambulatory Visit (INDEPENDENT_AMBULATORY_CARE_PROVIDER_SITE_OTHER): Payer: Self-pay | Admitting: Family Medicine

## 2021-09-26 ENCOUNTER — Encounter: Payer: Self-pay | Admitting: Family Medicine

## 2021-09-26 ENCOUNTER — Other Ambulatory Visit: Payer: Self-pay

## 2021-09-26 VITALS — BP 128/85 | HR 104 | Temp 98.0°F | Resp 18 | Wt 226.0 lb

## 2021-09-26 DIAGNOSIS — R0902 Hypoxemia: Secondary | ICD-10-CM

## 2021-09-26 DIAGNOSIS — R051 Acute cough: Secondary | ICD-10-CM

## 2021-09-26 DIAGNOSIS — R102 Pelvic and perineal pain: Secondary | ICD-10-CM | POA: Insufficient documentation

## 2021-09-26 DIAGNOSIS — J0140 Acute pansinusitis, unspecified: Secondary | ICD-10-CM

## 2021-09-26 NOTE — Progress Notes (Signed)
?  ? ? ?Established patient visit ? ? ?Patient: Danielle Walsh   DOB: 1956/05/08   66 y.o. Female  MRN: [id number] ?Visit Date: 09/26/2021 ? ?Today's healthcare provider: Wilhemena Durie, MD  ? ?Chief Complaint  ?Patient presents with  ? Sore Throat  ? ?Subjective  ?  ?HPI  ?Patient has has had cough, sore throat, headache, nasal congestion/drainage for 4 days. Patient's has been taking Mucinex DM Flonase, Tessalon which seems to help. ?Patient started sneezing 1 week ago and developed sinus congestion and sinus pain and now has  the deep cough. ?Medications: ?Outpatient Medications Prior to Visit  ?Medication Sig  ? acetaminophen (TYLENOL) 500 MG tablet Take 1 tablet (500 mg total) by mouth every 6 (six) hours as needed.  ? ALPRAZolam (XANAX) 0.5 MG tablet TAKE ONE TABLET BY MOUTH TWICE A DAY AS NEEDED FOR ANXIETY.  ? atorvastatin (LIPITOR) 20 MG tablet TAKE ONE TABLET BY MOUTH DAILY AT 6 P.M.  ? benzonatate (TESSALON) 200 MG capsule Take 1 capsule (200 mg total) by mouth 3 (three) times daily as needed.  ? cyclobenzaprine (FLEXERIL) 10 MG tablet TAKE ONE TABLET BY MOUTH THREE TIMES A DAY AS NEEDED FOR MUSCLE SPASMS  ? DULoxetine (CYMBALTA) 30 MG capsule TAKE THREE CAPSULES BY MOUTH DAILY  ? fluticasone (FLONASE) 50 MCG/ACT nasal spray Place 2 sprays into both nostrils daily.  ? hyoscyamine (LEVBID) 0.375 MG 12 hr tablet Take 1 tablet (0.375 mg total) by mouth every 12 (twelve) hours as needed.  ? losartan-hydrochlorothiazide (HYZAAR) 50-12.5 MG tablet TAKE ONE TABLET BY MOUTH DAILY  ? meclizine (ANTIVERT) 25 MG tablet Take by mouth.  ? omeprazole (PRILOSEC) 20 MG capsule TAKE ONE CAPSULE BY MOUTH DAILY  ? temazepam (RESTORIL) 30 MG capsule TAKE 1 CAPSULE BY MOUTH AT BEDTIME.  ? [DISCONTINUED] magic mouthwash (nystatin, lidocaine, diphenhydrAMINE) suspension Take 5 mLs by mouth 3 (three) times daily.  ? albuterol (VENTOLIN HFA) 108 (90 Base) MCG/ACT inhaler Inhale 2 puffs into the lungs every 6 (six) hours as  needed for wheezing or shortness of breath.  ? Budesonide 90 MCG/ACT inhaler Inhale 1 puff into the lungs 2 (two) times daily.  ? Omeprazole 20 MG TBEC Take 1 tablet (20 mg total) by mouth daily. (Patient not taking: Reported on 01/02/2020)  ? ?No facility-administered medications prior to visit.  ? ? ?Review of Systems  ?Constitutional:  Negative for appetite change, chills, fatigue and fever.  ?Respiratory:  Negative for chest tightness and shortness of breath.   ?Cardiovascular:  Negative for chest pain and palpitations.  ?Gastrointestinal:  Negative for abdominal pain, nausea and vomiting.  ?Neurological:  Negative for dizziness and weakness.  ? ?Last metabolic panel ?Lab Results  ?Component Value Date  ? GLUCOSE 103 (H) 09/13/2020  ? NA 140 09/13/2020  ? K 4.0 09/13/2020  ? CL 101 09/13/2020  ? CO2 23 09/13/2020  ? BUN 14 09/13/2020  ? CREATININE 1.00 09/13/2020  ? EGFR 63 09/13/2020  ? CALCIUM 9.7 09/13/2020  ? PHOS 2.6 (L) 01/02/2020  ? PROT 7.1 09/13/2020  ? ALBUMIN 4.6 09/13/2020  ? LABGLOB 2.5 09/13/2020  ? AGRATIO 1.8 09/13/2020  ? BILITOT 0.7 09/13/2020  ? ALKPHOS 116 09/13/2020  ? AST 14 09/13/2020  ? ALT 8 09/13/2020  ? ?  ?  Objective  ?  ?There were no vitals taken for this visit. ?BP Readings from Last 3 Encounters:  ?09/26/21 128/85  ?09/02/21 (!) 126/94  ?03/21/21 135/86  ? ?Wt Readings from Last  3 Encounters:  ?09/26/21 226 lb (102.5 kg)  ?09/02/21 226 lb (102.5 kg)  ?03/21/21 228 lb (103.4 kg)  ? ?  ? ?Physical Exam ?Vitals reviewed.  ?Constitutional:   ?   Appearance: She is well-developed.  ?HENT:  ?   Head: Normocephalic and atraumatic.  ?   Right Ear: External ear normal.  ?   Left Ear: External ear normal.  ?   Nose: Nose normal.  ?   Mouth/Throat:  ?   Mouth: Mucous membranes are moist.  ?   Pharynx: Oropharynx is clear.  ?Eyes:  ?   General: No scleral icterus. ?   Conjunctiva/sclera: Conjunctivae normal.  ?Neck:  ?   Thyroid: No thyromegaly.  ?Cardiovascular:  ?   Rate and Rhythm: Normal  rate and regular rhythm.  ?   Heart sounds: Normal heart sounds.  ?Pulmonary:  ?   Effort: Pulmonary effort is normal.  ?   Breath sounds: Normal breath sounds.  ?Abdominal:  ?   Palpations: Abdomen is soft.  ?Skin: ?   General: Skin is warm and dry.  ?Neurological:  ?   General: No focal deficit present.  ?   Mental Status: She is alert and oriented to person, place, and time.  ?Psychiatric:     ?   Mood and Affect: Mood normal.     ?   Behavior: Behavior normal.     ?   Thought Content: Thought content normal.     ?   Judgment: Judgment normal.  ?  ? ? ?No results found for any visits on 09/26/21. ? Assessment & Plan  ?  ? ?1. Subacute pansinusitis ?Doxycycline  for a week. ? ?2. Hypoxia ?Check chest x-ray for possible walking pneumonia ?- DG Chest 2 View; Future ? ?3. Acute cough ? ?- DG Chest 2 View; Future ? ? ?No follow-ups on file.  ?   ? ?I, Wilhemena Durie, MD, have reviewed all documentation for this visit. The documentation on 09/27/21 for the exam, diagnosis, procedures, and orders are all accurate and complete. ? ? ? ?Richard Cranford Mon, MD  ?[facility] ?[phone] (phone) ?[phone] (fax) ? ?[facility] ?

## 2021-10-16 ENCOUNTER — Ambulatory Visit (INDEPENDENT_AMBULATORY_CARE_PROVIDER_SITE_OTHER): Payer: PPO | Admitting: Physician Assistant

## 2021-10-16 ENCOUNTER — Encounter: Payer: Self-pay | Admitting: Physician Assistant

## 2021-10-16 VITALS — BP 151/114 | HR 92 | Temp 98.2°F | Ht 67.0 in | Wt 227.2 lb

## 2021-10-16 DIAGNOSIS — J020 Streptococcal pharyngitis: Secondary | ICD-10-CM | POA: Diagnosis not present

## 2021-10-16 MED ORDER — CEPHALEXIN 500 MG PO CAPS: 500.0000 mg | ORAL_CAPSULE | Freq: Two times a day (BID) | ORAL | 0 refills | Status: AC

## 2021-10-16 NOTE — Progress Notes (Signed)
? ?Acute Office Visit ? ?Subjective:  ? ? Patient ID: Danielle Walsh, female    DOB: [birth year], 66 y.o.   MRN: [id number] ? ?Cc. Sore throat x 4 days ? ? ?HPI ?Danielle Walsh is a 66 y/o female who presents today with sore throat, nasal congestion, fatigue x 4 days.  ? ?Past Medical History:  ?Diagnosis Date  ? Anxiety   ? Arthritis   ? hands, knees  ? Depression   ? GERD (gastroesophageal reflux disease)   ? Headache   ? 1/momth migraine  ? Hyperlipidemia   ? Hypertension   ? controlled on meds  ? IBS (irritable bowel syndrome)   ? Vertigo   ? HX of  ? ? ?Past Surgical History:  ?Procedure Laterality Date  ? KNEE ARTHROSCOPY Left 2012  ? meniscus repair  ? LEEP  1998  ? OTHER SURGICAL HISTORY    ? thermal ablasion-uterine area due to abnormal bleeding  ? ROTATOR CUFF REPAIR    ? TUBAL LIGATION  1995  ? ? ?Family History  ?Problem Relation Age of Onset  ? Hypertension Mother   ? Arthritis Mother   ? Diabetes Brother   ? Hyperlipidemia Brother   ? Alzheimer's disease Father   ? ? ?Social History  ? ?Socioeconomic History  ? Marital status: Married  ?  Spouse name: Not on file  ? Number of children: Not on file  ? Years of education: Not on file  ? Highest education level: Not on file  ?Occupational History  ? Not on file  ?Tobacco Use  ? Smoking status: Never  ? Smokeless tobacco: Never  ?Substance and Sexual Activity  ? Alcohol use: Yes  ?  Alcohol/week: 1.0 standard drink  ?  Types: 1 Glasses of wine per week  ?  Comment: occasionally  ? Drug use: Never  ? Sexual activity: Not on file  ?Other Topics Concern  ? Not on file  ?Social History Narrative  ? Not on file  ? ?Social Determinants of Health  ? ?Financial Resource Strain: Not on file  ?Food Insecurity: Not on file  ?Transportation Needs: Not on file  ?Physical Activity: Not on file  ?Stress: Not on file  ?Social Connections: Not on file  ?Intimate Partner Violence: Not on file  ? ? ?Outpatient Medications Prior to Visit  ?Medication Sig Dispense Refill  ? acetaminophen  (TYLENOL) 500 MG tablet Take 1 tablet (500 mg total) by mouth every 6 (six) hours as needed. 30 tablet 0  ? albuterol (VENTOLIN HFA) 108 (90 Base) MCG/ACT inhaler Inhale 2 puffs into the lungs every 6 (six) hours as needed for wheezing or shortness of breath. 18 g 0  ? ALPRAZolam (XANAX) 0.5 MG tablet TAKE ONE TABLET BY MOUTH TWICE A DAY AS NEEDED FOR ANXIETY. 60 tablet 3  ? atorvastatin (LIPITOR) 20 MG tablet TAKE ONE TABLET BY MOUTH DAILY AT 6 P.M. 30 tablet 11  ? benzonatate (TESSALON) 200 MG capsule Take 1 capsule (200 mg total) by mouth 3 (three) times daily as needed. 30 capsule 2  ? Budesonide 90 MCG/ACT inhaler Inhale 1 puff into the lungs 2 (two) times daily. 1 each 0  ? cyclobenzaprine (FLEXERIL) 10 MG tablet TAKE ONE TABLET BY MOUTH THREE TIMES A DAY AS NEEDED FOR MUSCLE SPASMS 90 tablet 3  ? DULoxetine (CYMBALTA) 30 MG capsule TAKE THREE CAPSULES BY MOUTH DAILY 270 capsule 0  ? fluticasone (FLONASE) 50 MCG/ACT nasal spray Place 2 sprays into both nostrils daily. 9.9  g 0  ? hyoscyamine (LEVBID) 0.375 MG 12 hr tablet Take 1 tablet (0.375 mg total) by mouth every 12 (twelve) hours as needed. 60 tablet 12  ? losartan-hydrochlorothiazide (HYZAAR) 50-12.5 MG tablet TAKE ONE TABLET BY MOUTH DAILY 30 tablet 12  ? meclizine (ANTIVERT) 25 MG tablet Take by mouth.    ? omeprazole (PRILOSEC) 20 MG capsule TAKE ONE CAPSULE BY MOUTH DAILY 90 capsule 3  ? Omeprazole 20 MG TBEC Take 1 tablet (20 mg total) by mouth daily. (Patient not taking: Reported on 01/02/2020) 90 each 3  ? temazepam (RESTORIL) 30 MG capsule TAKE 1 CAPSULE BY MOUTH AT BEDTIME. 30 capsule 3  ? ?No facility-administered medications prior to visit.  ? ? ?Allergies  ?Allergen Reactions  ? Hydrocodone-Acetaminophen Itching  ? Meperidine And Related Itching  ? Penicillins Itching and Rash  ? ? ?Review of Systems  ?Constitutional:  Negative for fatigue and fever.  ?HENT:  Positive for sore throat and voice change.   ?Respiratory:  Negative for cough and  shortness of breath.   ?Cardiovascular:  Negative for chest pain and leg swelling.  ?Gastrointestinal:  Negative for abdominal pain.  ?Neurological:  Negative for dizziness and headaches.  ? ?   ?Objective:  ?  ?Physical Exam ?Constitutional:   ?   General: She is awake.  ?   Appearance: She is well-developed.  ?HENT:  ?   Head: Normocephalic.  ?   Right Ear: Tympanic membrane normal.  ?   Left Ear: Tympanic membrane normal.  ?   Mouth/Throat:  ?   Pharynx: Oropharyngeal exudate and posterior oropharyngeal erythema present.  ?Eyes:  ?   Conjunctiva/sclera: Conjunctivae normal.  ?Cardiovascular:  ?   Rate and Rhythm: Normal rate and regular rhythm.  ?   Heart sounds: Normal heart sounds.  ?Pulmonary:  ?   Effort: Pulmonary effort is normal.  ?   Breath sounds: Normal breath sounds.  ?Skin: ?   General: Skin is warm.  ?Neurological:  ?   Mental Status: She is alert and oriented to person, place, and time.  ?Psychiatric:     ?   Attention and Perception: Attention normal.     ?   Mood and Affect: Mood normal.     ?   Speech: Speech normal.     ?   Behavior: Behavior is cooperative.  ? ? ?There were no vitals taken for this visit. ?Wt Readings from Last 3 Encounters:  ?09/26/21 226 lb (102.5 kg)  ?09/02/21 226 lb (102.5 kg)  ?03/21/21 228 lb (103.4 kg)  ? ? ?Health Maintenance Due  ?Topic Date Due  ? COVID-19 Vaccine (1) Never done  ? COLONOSCOPY (Pts 45-59yr Insurance coverage will need to be confirmed)  Never done  ? Zoster Vaccines- Shingrix (1 of 2) Never done  ? MAMMOGRAM  09/18/2017  ? DEXA SCAN  Never done  ? PAP SMEAR-Modifier  08/25/2021  ? Pneumonia Vaccine 65 Years old (1 - PCV) 10/13/2021  ? ? ?There are no preventive care reminders to display for this patient. ? ? ?Lab Results  ?Component Value Date  ? TSH 3.500 09/13/2020  ? ?Lab Results  ?Component Value Date  ? WBC 7.8 09/13/2020  ? HGB 13.4 09/13/2020  ? HCT 41.6 09/13/2020  ? MCV 91 09/13/2020  ? PLT 191 09/13/2020  ? ?Lab Results  ?Component Value  Date  ? NA 140 09/13/2020  ? K 4.0 09/13/2020  ? CO2 23 09/13/2020  ? GLUCOSE 103 (H) 09/13/2020  ?  BUN 14 09/13/2020  ? CREATININE 1.00 09/13/2020  ? BILITOT 0.7 09/13/2020  ? ALKPHOS 116 09/13/2020  ? AST 14 09/13/2020  ? ALT 8 09/13/2020  ? PROT 7.1 09/13/2020  ? ALBUMIN 4.6 09/13/2020  ? CALCIUM 9.7 09/13/2020  ? EGFR 63 09/13/2020  ? ?Lab Results  ?Component Value Date  ? CHOL 234 (H) 09/13/2020  ? ?Lab Results  ?Component Value Date  ? HDL 57 09/13/2020  ? ?Lab Results  ?Component Value Date  ? LDLCALC 152 (H) 09/13/2020  ? ?Lab Results  ?Component Value Date  ? TRIG 138 09/13/2020  ? ?Lab Results  ?Component Value Date  ? CHOLHDL 4.1 09/13/2020  ? ?Lab Results  ?Component Value Date  ? HGBA1C 5.8 (H) 04/06/2019  ? ? ?   ?Assessment & Plan:  ? ?Strep pharyngitis ?POC strep test positive ?Rx keflex 500 mg BID x 10 days  ? ?I, Mikey Kirschner, PA-C have reviewed all documentation for this visit. The documentation on  10/07/2021 for the exam, diagnosis, procedures, and orders are all accurate and complete. ? ?Mikey Kirschner, PA-C ?[facility] ?[id number] ?[place], [place], [address] ?Office: [phone] ?Fax: [phone]  ? ? ?

## 2021-10-25 DIAGNOSIS — M1712 Unilateral primary osteoarthritis, left knee: Secondary | ICD-10-CM | POA: Diagnosis not present

## 2021-10-25 DIAGNOSIS — M79672 Pain in left foot: Secondary | ICD-10-CM | POA: Diagnosis not present

## 2021-12-04 IMAGING — CT CT ABD-PELV W/ CM
2 of 5 series · 17 of 46 positions shown, 19 images · IV contrast (omnipaque)
Comparison: None.

CLINICAL DATA: Left lower quadrant pain

EXAM:
CT ABDOMEN AND PELVIS WITH CONTRAST
TECHNIQUE: Multidetector CT imaging of the abdomen and pelvis was performed
using the standard protocol following bolus administration of
intravenous contrast.
CONTRAST:  100mL OMNIPAQUE IOHEXOL 300 MG/ML  SOLN

[Series 2: abd pelvis 5.00 · axial · 0.93mm/px · z∈[-1533,-1083]mm · 14 of 103 slices shown, 16 images]
[im 7/103  soft-tissue]
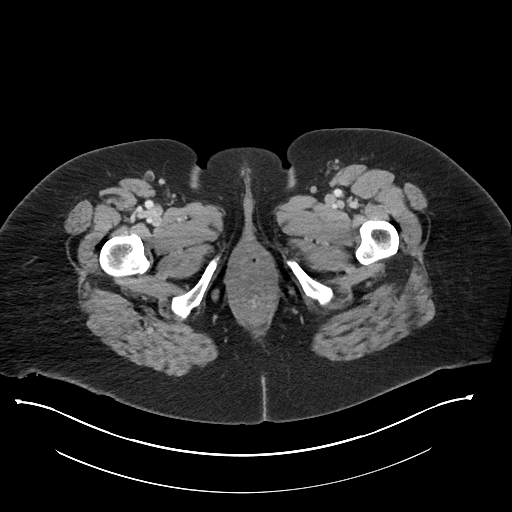
[im 7/103  bone]
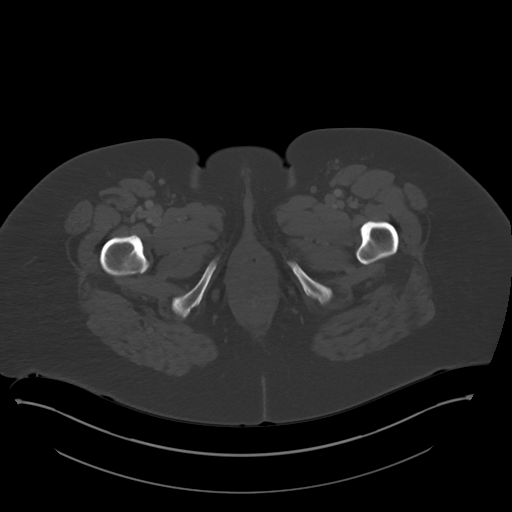
[im 13/103  soft-tissue]
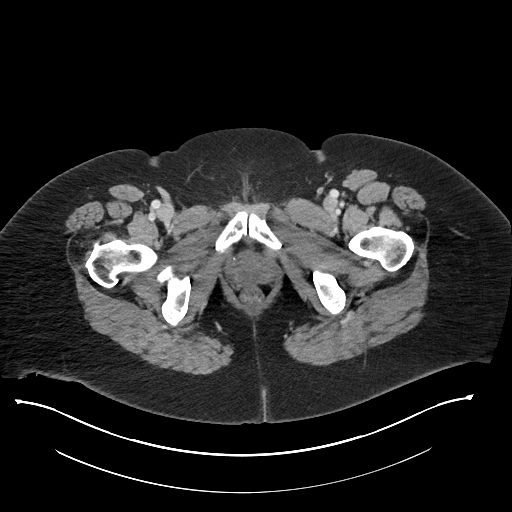
[im 19/103  soft-tissue]
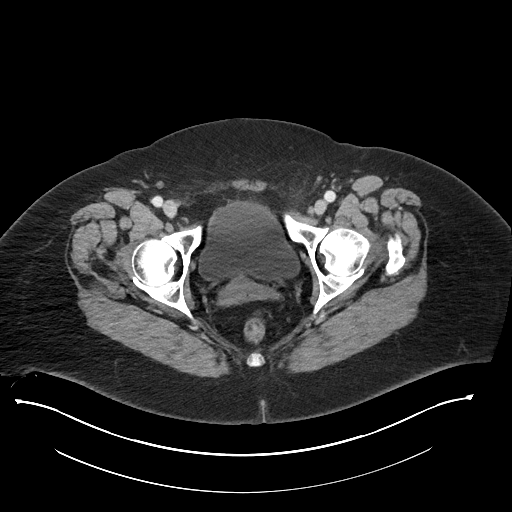
[im 31/103  soft-tissue]
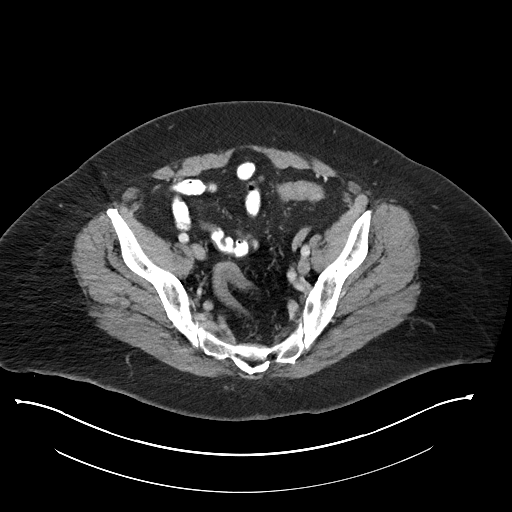
[im 37/103  soft-tissue]
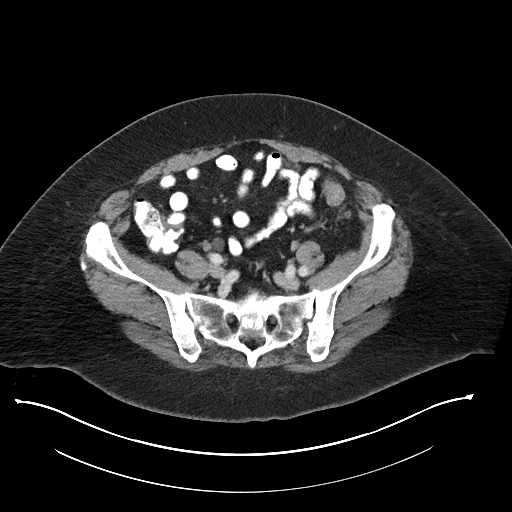
[im 43/103  soft-tissue]
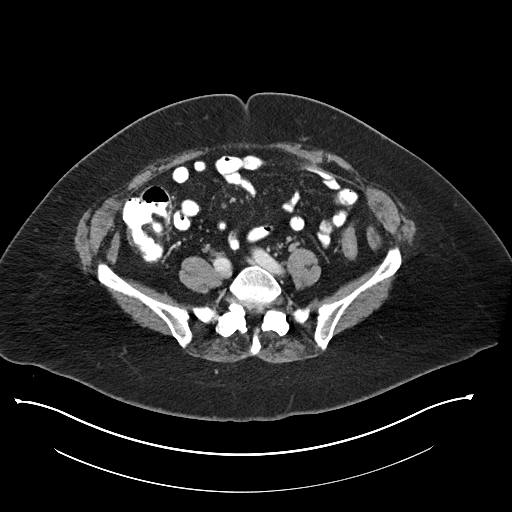
[im 49/103  soft-tissue]
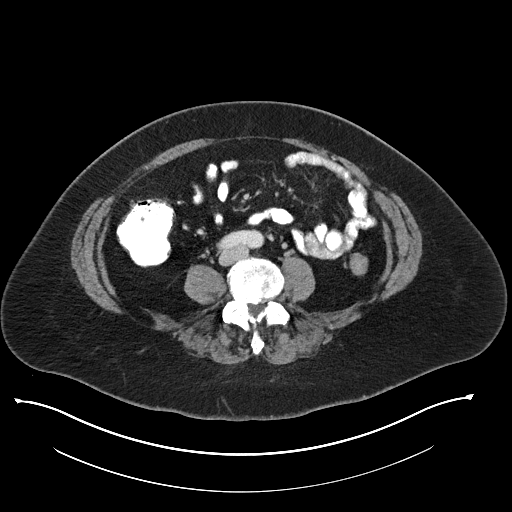
[im 55/103  soft-tissue]
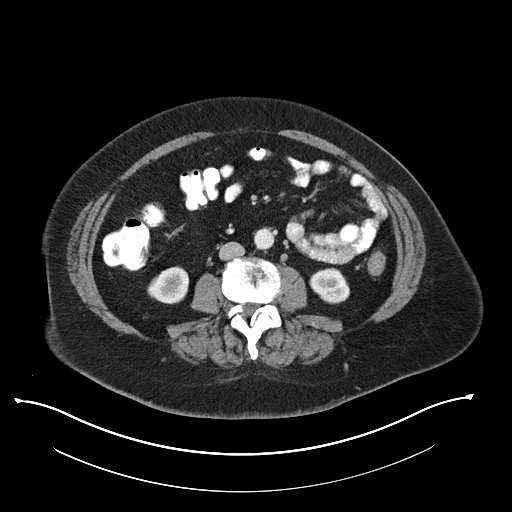
[im 61/103  soft-tissue]
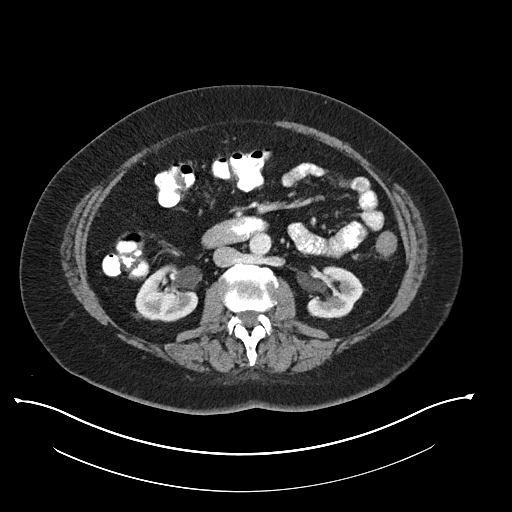
[im 61/103  bone]
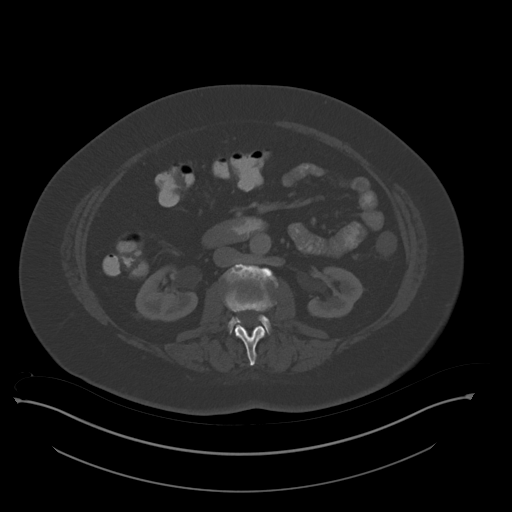
[im 67/103  soft-tissue]
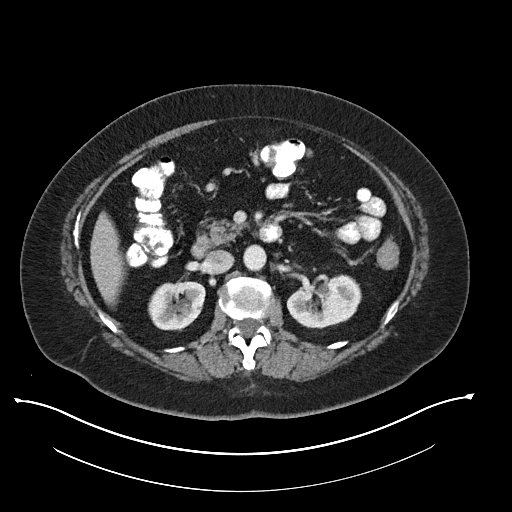
[im 79/103  soft-tissue]
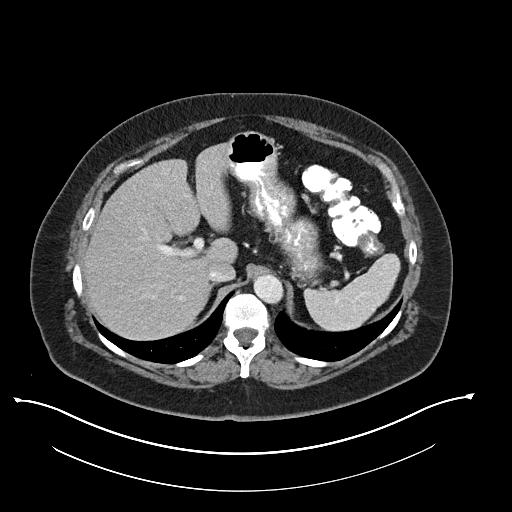
[im 85/103  soft-tissue]
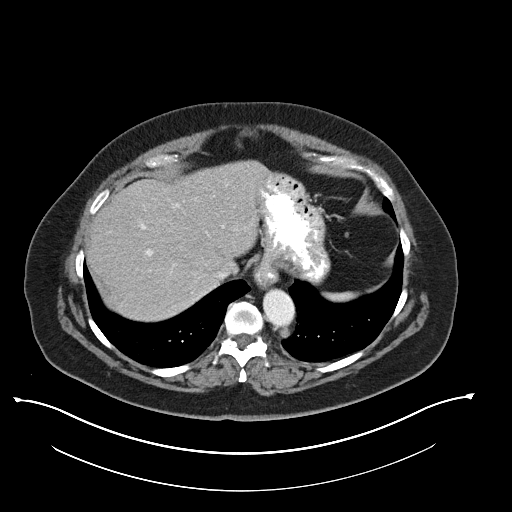
[im 91/103  soft-tissue]
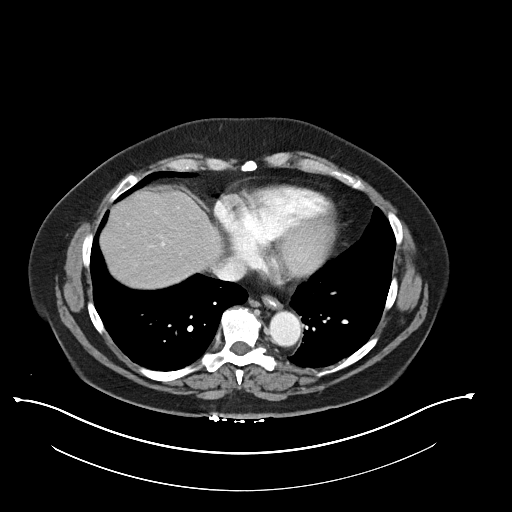
[im 97/103  soft-tissue]
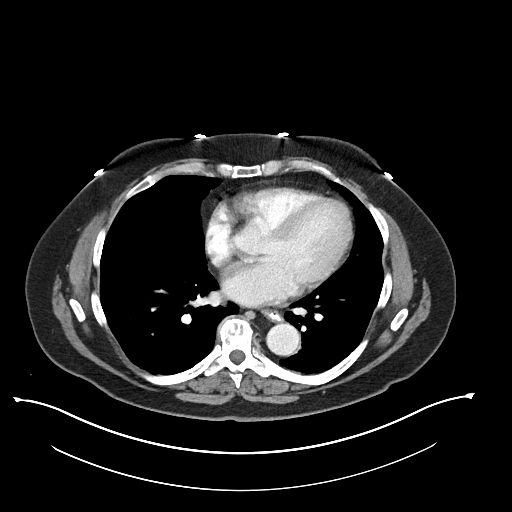

[Series 4: coronals abd pelvis 2.00 cor · coronal · 0.92mm/px · 3 of 160 slices shown]
[im 54/160  soft-tissue]
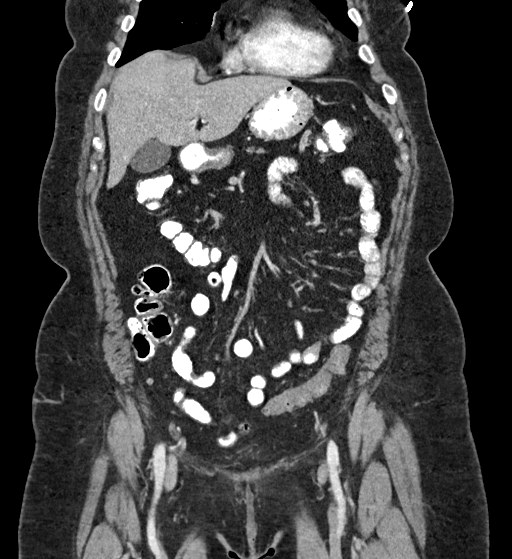
[im 71/160  soft-tissue]
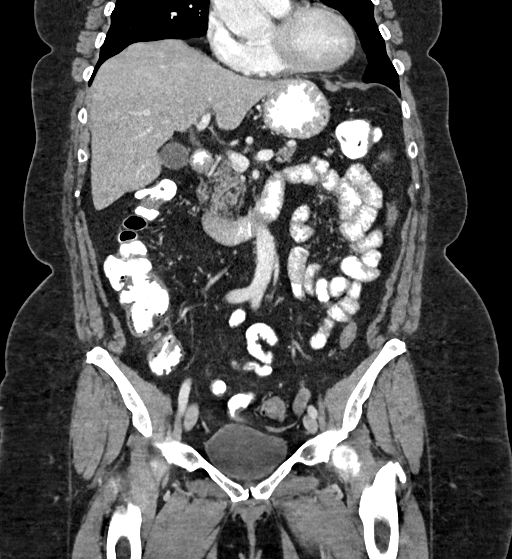
[im 89/160  soft-tissue]
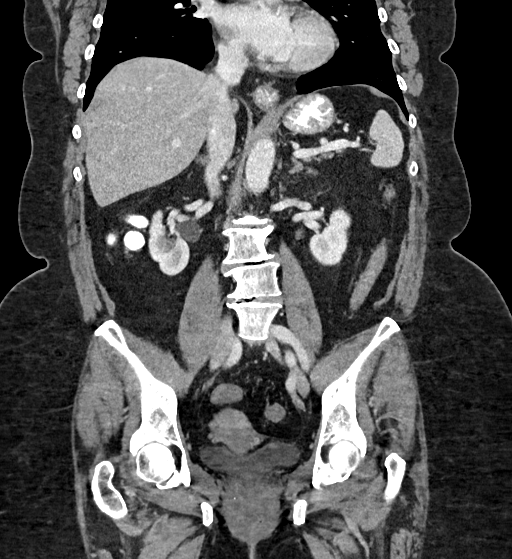

[17 of 46 positions shown; findings below may reference images not displayed]

FINDINGS: Lower chest: Lung bases are clear. No effusions. Heart is normal
size.

Hepatobiliary: No focal hepatic abnormality. Gallbladder
unremarkable.

Pancreas: No focal abnormality or ductal dilatation.

Spleen: No focal abnormality.  Normal size.

Adrenals/Urinary Tract: No adrenal abnormality. No focal renal
abnormality. No stones or hydronephrosis. Urinary bladder is
unremarkable.

Stomach/Bowel: Sigmoid and descending colonic diverticulosis. No
active diverticulitis. Stomach and small bowel decompressed,
unremarkable.

Vascular/Lymphatic: No evidence of aneurysm or adenopathy.

Reproductive: Uterus and adnexa unremarkable.  No mass.

Other: No free fluid or free air.

Musculoskeletal: No acute bony abnormality. Degenerative disc and
facet disease in the lower lumbar spine.
IMPRESSION: Left colonic diverticulosis.  No active diverticulitis.

## 2021-12-09 ENCOUNTER — Ambulatory Visit (INDEPENDENT_AMBULATORY_CARE_PROVIDER_SITE_OTHER): Payer: PPO | Admitting: Family Medicine

## 2021-12-09 ENCOUNTER — Encounter: Payer: Self-pay | Admitting: Family Medicine

## 2021-12-09 VITALS — BP 137/89 | HR 81 | Resp 16 | Wt 224.0 lb

## 2021-12-09 DIAGNOSIS — E7849 Other hyperlipidemia: Secondary | ICD-10-CM

## 2021-12-09 DIAGNOSIS — N189 Chronic kidney disease, unspecified: Secondary | ICD-10-CM

## 2021-12-09 DIAGNOSIS — F419 Anxiety disorder, unspecified: Secondary | ICD-10-CM

## 2021-12-09 DIAGNOSIS — Z1231 Encounter for screening mammogram for malignant neoplasm of breast: Secondary | ICD-10-CM

## 2021-12-09 DIAGNOSIS — K219 Gastro-esophageal reflux disease without esophagitis: Secondary | ICD-10-CM | POA: Diagnosis not present

## 2021-12-09 DIAGNOSIS — F32A Depression, unspecified: Secondary | ICD-10-CM

## 2021-12-09 DIAGNOSIS — E7800 Pure hypercholesterolemia, unspecified: Secondary | ICD-10-CM | POA: Diagnosis not present

## 2021-12-09 DIAGNOSIS — I10 Essential (primary) hypertension: Secondary | ICD-10-CM | POA: Diagnosis not present

## 2021-12-09 DIAGNOSIS — G4709 Other insomnia: Secondary | ICD-10-CM | POA: Diagnosis not present

## 2021-12-09 MED ORDER — TEMAZEPAM 30 MG PO CAPS: ORAL_CAPSULE | ORAL | 3 refills | Status: DC

## 2021-12-09 MED ORDER — ALPRAZOLAM 0.5 MG PO TABS: ORAL_TABLET | ORAL | 3 refills | Status: AC

## 2021-12-09 MED ORDER — LOSARTAN POTASSIUM-HCTZ 50-12.5 MG PO TABS: 1.0000 | ORAL_TABLET | Freq: Every day | ORAL | 12 refills | Status: DC

## 2021-12-09 MED ORDER — DULOXETINE HCL 30 MG PO CPEP: 90.0000 mg | ORAL_CAPSULE | Freq: Every day | ORAL | 0 refills | Status: DC

## 2021-12-09 MED ORDER — TEMAZEPAM 30 MG PO CAPS: ORAL_CAPSULE | ORAL | 3 refills | Status: AC

## 2021-12-09 MED ORDER — OMEPRAZOLE 20 MG PO CPDR: DELAYED_RELEASE_CAPSULE | ORAL | 3 refills | Status: DC

## 2021-12-09 MED ORDER — ATORVASTATIN CALCIUM 20 MG PO TABS: ORAL_TABLET | ORAL | 11 refills | Status: AC

## 2021-12-09 NOTE — Patient Instructions (Signed)
Call [employer]

## 2021-12-09 NOTE — Progress Notes (Signed)
Established patient visit  I,April Miller,acting as a scribe for Wilhemena Durie, MD.,have documented all relevant documentation on the behalf of Wilhemena Durie, MD,as directed by  Wilhemena Durie, MD while in the presence of Wilhemena Durie, MD.   Patient: Danielle Walsh   DOB: Jun 29, 1956   66 y.o. Female  MRN: [id number] Visit Date: 12/09/2021  Today's healthcare provider: Wilhemena Durie, MD   Chief Complaint  Patient presents with   Follow-up   Hypertension   Hyperlipidemia   Anxiety   Depression   Subjective    HPI  She comes in today for follow-up.  She is having more problems with anxiety. She has not followed up on counseling yet. Her PHQ-9 score today is 4.  GAD-7 is 7  Hypertension, follow-up  BP Readings from Last 3 Encounters:  12/09/21 137/89  10/16/21 (!) 151/114  09/26/21 128/85   Wt Readings from Last 3 Encounters:  12/09/21 224 lb (101.6 kg)  10/16/21 227 lb 3.2 oz (103.1 kg)  09/26/21 226 lb (102.5 kg)     She was last seen for hypertension 9 months ago.  Management since that visit includes; Good control on losartan HCT.  Outside blood pressures are 130/85.  --------------------------------------------------------------------------------------------------- Lipid/Cholesterol, follow-up  Last Lipid Panel: Lab Results  Component Value Date   CHOL 234 (H) 09/13/2020   LDLCALC 152 (H) 09/13/2020   HDL 57 09/13/2020   TRIG 138 09/13/2020    She was last seen for this 09/13/2020.  Management since that visit includes taking atorvastatin 20 mg.  Last metabolic panel Lab Results  Component Value Date   GLUCOSE 103 (H) 09/13/2020   NA 140 09/13/2020   K 4.0 09/13/2020   BUN 14 09/13/2020   CREATININE 1.00 09/13/2020   EGFR 63 09/13/2020   GFRNONAA 61 01/02/2020   CALCIUM 9.7 09/13/2020   AST 14 09/13/2020   ALT 8 09/13/2020   The 10-year ASCVD risk score (Arnett DK, et al., 2019) is:  9.1%  --------------------------------------------------------------------------------------------------- Anxiety and Depression:  She was last seen for anxiety 9 months ago. Changes made at last visit include; Situational stress.  I have recommended patient call [facility] counseling, she is also talking to her pastor.  GAD-7 Results     View : No data to display.          PHQ-9 Scores    09/10/2020    4:09 PM 08/25/2018    2:08 PM 04/13/2017    3:12 PM  PHQ9 SCORE ONLY  PHQ-9 Total Score 4 6 2     ---------------------------------------------------------------------------------------------------   Medications: Outpatient Medications Prior to Visit  Medication Sig   acetaminophen (TYLENOL) 500 MG tablet Take 1 tablet (500 mg total) by mouth every 6 (six) hours as needed.   ALPRAZolam (XANAX) 0.5 MG tablet TAKE ONE TABLET BY MOUTH TWICE A DAY AS NEEDED FOR ANXIETY.   atorvastatin (LIPITOR) 20 MG tablet TAKE ONE TABLET BY MOUTH DAILY AT 6 P.M.   cyclobenzaprine (FLEXERIL) 10 MG tablet TAKE ONE TABLET BY MOUTH THREE TIMES A DAY AS NEEDED FOR MUSCLE SPASMS   DULoxetine (CYMBALTA) 30 MG capsule TAKE THREE CAPSULES BY MOUTH DAILY   hyoscyamine (LEVBID) 0.375 MG 12 hr tablet Take 1 tablet (0.375 mg total) by mouth every 12 (twelve) hours as needed.   losartan-hydrochlorothiazide (HYZAAR) 50-12.5 MG tablet TAKE ONE TABLET BY MOUTH DAILY   meclizine (ANTIVERT) 25 MG tablet Take by mouth.   omeprazole (PRILOSEC) 20 MG capsule  TAKE ONE CAPSULE BY MOUTH DAILY   temazepam (RESTORIL) 30 MG capsule TAKE 1 CAPSULE BY MOUTH AT BEDTIME.   [DISCONTINUED] albuterol (VENTOLIN HFA) 108 (90 Base) MCG/ACT inhaler Inhale 2 puffs into the lungs every 6 (six) hours as needed for wheezing or shortness of breath.   [DISCONTINUED] benzonatate (TESSALON) 200 MG capsule Take 1 capsule (200 mg total) by mouth 3 (three) times daily as needed.   [DISCONTINUED] Budesonide 90 MCG/ACT inhaler Inhale 1 puff into the  lungs 2 (two) times daily.   [DISCONTINUED] fluticasone (FLONASE) 50 MCG/ACT nasal spray Place 2 sprays into both nostrils daily.   [DISCONTINUED] Omeprazole 20 MG TBEC Take 1 tablet (20 mg total) by mouth daily.   No facility-administered medications prior to visit.    Review of Systems  Constitutional:  Negative for appetite change, chills, fatigue and fever.  Respiratory:  Negative for chest tightness and shortness of breath.   Cardiovascular:  Negative for chest pain and palpitations.  Gastrointestinal:  Negative for abdominal pain, nausea and vomiting.  Neurological:  Negative for dizziness and weakness.   Last thyroid functions Lab Results  Component Value Date   TSH 2.220 12/09/2021       Objective    BP 137/89 (BP Location: Right Arm, Patient Position: Sitting, Cuff Size: Large)   Pulse 81   Resp 16   Wt 224 lb (101.6 kg)   SpO2 96%   BMI 35.08 kg/m  BP Readings from Last 3 Encounters:  12/09/21 137/89  10/16/21 (!) 151/114  09/26/21 128/85   Wt Readings from Last 3 Encounters:  12/09/21 224 lb (101.6 kg)  10/16/21 227 lb 3.2 oz (103.1 kg)  09/26/21 226 lb (102.5 kg)      Physical Exam Vitals reviewed.  Constitutional:      General: She is not in acute distress.    Appearance: She is well-developed.  HENT:     Head: Normocephalic and atraumatic.     Right Ear: Hearing normal.     Left Ear: Hearing normal.     Nose: Nose normal.  Eyes:     General: Lids are normal. No scleral icterus.       Right eye: No discharge.        Left eye: No discharge.     Conjunctiva/sclera: Conjunctivae normal.  Cardiovascular:     Rate and Rhythm: Normal rate and regular rhythm.     Heart sounds: Normal heart sounds.  Pulmonary:     Effort: Pulmonary effort is normal. No respiratory distress.  Skin:    Findings: No lesion or rash.  Neurological:     General: No focal deficit present.     Mental Status: She is alert and oriented to person, place, and time.   Psychiatric:        Mood and Affect: Mood normal.        Speech: Speech normal.        Behavior: Behavior normal.        Thought Content: Thought content normal.        Judgment: Judgment normal.       No results found for any visits on 12/09/21.  Assessment & Plan     1. Essential (primary) hypertension Good blood pressure control - Lipid panel - TSH - CBC w/Diff/Platelet - Comprehensive Metabolic Panel (CMET) - losartan-hydrochlorothiazide (HYZAAR) 50-12.5 MG tablet; Take 1 tablet by mouth daily.  Dispense: 30 tablet; Refill: 12  2. Pure hypercholesterolemia  - Lipid panel - TSH - CBC  w/Diff/Platelet - Comprehensive Metabolic Panel (CMET)  3. Chronic kidney disease, unspecified CKD stage  - Lipid panel - TSH - CBC w/Diff/Platelet - Comprehensive Metabolic Panel (CMET)  4. Anxiety and depression Increase duloxetine dose.  Recommend calling [facility] for counseling - Lipid panel - TSH - CBC w/Diff/Platelet - Comprehensive Metabolic Panel (CMET) - DULoxetine (CYMBALTA) 30 MG capsule; Take 3 capsules (90 mg total) by mouth daily.  Dispense: 270 capsule; Refill: 0 - ALPRAZolam (XANAX) 0.5 MG tablet; BID prn anxiety  Dispense: 60 tablet; Refill: 3 - temazepam (RESTORIL) 30 MG capsule; TAKE 1 CAPSULE BY MOUTH AT BEDTIME prn sleep  Dispense: 30 capsule; Refill: 3  5. Encounter for screening mammogram for malignant neoplasm of breast  - MM 3D SCREEN BREAST BILATERAL  6. Other hyperlipidemia  - atorvastatin (LIPITOR) 20 MG tablet; TAKE ONE TABLET BY MOUTH DAILY AT 6 P.M.  Dispense: 30 tablet; Refill: 11  7. Gastro-esophageal reflux disease without esophagitis  - omeprazole (PRILOSEC) 20 MG capsule; TAKE ONE CAPSULE BY MOUTH DAILY  Dispense: 90 capsule; Refill: 3   No follow-ups on file.      I, Wilhemena Durie, MD, have reviewed all documentation for this visit. The documentation on 12/14/21 for the exam, diagnosis, procedures, and orders are all accurate  and complete.    Lavita Pontius Cranford Mon, MD  [facility] [phone] (phone) [phone] (fax)  [place]

## 2021-12-10 LAB — CBC WITH DIFFERENTIAL/PLATELET
Basophils Absolute: 0.1 10*3/uL (ref 0.0–0.2)
Basos: 1 %
EOS (ABSOLUTE): 0.1 10*3/uL (ref 0.0–0.4)
Eos: 1 %
Hematocrit: 43.7 % (ref 34.0–46.6)
Hemoglobin: 14.2 g/dL (ref 11.1–15.9)
Immature Grans (Abs): 0 10*3/uL (ref 0.0–0.1)
Immature Granulocytes: 0 %
Lymphocytes Absolute: 1.8 10*3/uL (ref 0.7–3.1)
Lymphs: 25 %
MCH: 30.6 pg (ref 26.6–33.0)
MCHC: 32.5 g/dL (ref 31.5–35.7)
MCV: 94 fL (ref 79–97)
Monocytes Absolute: 0.5 10*3/uL (ref 0.1–0.9)
Monocytes: 6 %
Neutrophils Absolute: 4.8 10*3/uL (ref 1.4–7.0)
Neutrophils: 67 %
Platelets: 176 10*3/uL (ref 150–450)
RBC: 4.64 x10E6/uL (ref 3.77–5.28)
RDW: 12.4 % (ref 11.7–15.4)
WBC: 7.2 10*3/uL (ref 3.4–10.8)

## 2021-12-10 LAB — COMPREHENSIVE METABOLIC PANEL
ALT: 10 IU/L (ref 0–32)
AST: 16 IU/L (ref 0–40)
Albumin/Globulin Ratio: 1.7 (ref 1.2–2.2)
Albumin: 4.7 g/dL (ref 3.8–4.8)
Alkaline Phosphatase: 118 IU/L (ref 44–121)
BUN/Creatinine Ratio: 14 (ref 12–28)
BUN: 15 mg/dL (ref 8–27)
Bilirubin Total: 0.7 mg/dL (ref 0.0–1.2)
CO2: 25 mmol/L (ref 20–29)
Calcium: 9.8 mg/dL (ref 8.7–10.3)
Chloride: 102 mmol/L (ref 96–106)
Creatinine, Ser: 1.09 mg/dL — ABNORMAL HIGH (ref 0.57–1.00)
Globulin, Total: 2.8 g/dL (ref 1.5–4.5)
Glucose: 95 mg/dL (ref 70–99)
Potassium: 4.1 mmol/L (ref 3.5–5.2)
Sodium: 143 mmol/L (ref 134–144)
Total Protein: 7.5 g/dL (ref 6.0–8.5)
eGFR: 56 mL/min/{1.73_m2} — ABNORMAL LOW (ref >59)

## 2021-12-10 LAB — LIPID PANEL
Chol/HDL Ratio: 2.8 ratio (ref 0.0–4.4)
Cholesterol, Total: 178 mg/dL (ref 100–199)
HDL: 64 mg/dL (ref >39)
LDL Chol Calc (NIH): 100 mg/dL — ABNORMAL HIGH (ref 0–99)
Triglycerides: 75 mg/dL (ref 0–149)
VLDL Cholesterol Cal: 14 mg/dL (ref 5–40)

## 2021-12-10 LAB — TSH: TSH: 2.22 u[IU]/mL (ref 0.450–4.500)

## 2022-01-13 NOTE — Progress Notes (Deleted)
      Established patient visit   Patient: Danielle Walsh   DOB: 1956/06/03   66 y.o. Female  MRN: [id number] Visit Date: 01/14/2022  Today's healthcare provider: Megan Mans, MD   No chief complaint on file.  Subjective    HPI  Patient is a 66 year old female who presents for evaluation of chronic cough.    Medications: Outpatient Medications Prior to Visit  Medication Sig   acetaminophen (TYLENOL) 500 MG tablet Take 1 tablet (500 mg total) by mouth every 6 (six) hours as needed.   ALPRAZolam (XANAX) 0.5 MG tablet BID prn anxiety   atorvastatin (LIPITOR) 20 MG tablet TAKE ONE TABLET BY MOUTH DAILY AT 6 P.M.   cyclobenzaprine (FLEXERIL) 10 MG tablet TAKE ONE TABLET BY MOUTH THREE TIMES A DAY AS NEEDED FOR MUSCLE SPASMS   DULoxetine (CYMBALTA) 30 MG capsule Take 3 capsules (90 mg total) by mouth daily.   hyoscyamine (LEVBID) 0.375 MG 12 hr tablet Take 1 tablet (0.375 mg total) by mouth every 12 (twelve) hours as needed.   losartan-hydrochlorothiazide (HYZAAR) 50-12.5 MG tablet Take 1 tablet by mouth daily.   meclizine (ANTIVERT) 25 MG tablet Take by mouth.   omeprazole (PRILOSEC) 20 MG capsule TAKE ONE CAPSULE BY MOUTH DAILY   temazepam (RESTORIL) 30 MG capsule TAKE 1 CAPSULE BY MOUTH AT BEDTIME prn sleep   No facility-administered medications prior to visit.    Review of Systems  {Labs  Heme  Chem  Endocrine  Serology  Results Review (optional):23779}   Objective    There were no vitals taken for this visit. {Show previous vital signs (optional):23777}  Physical Exam  ***  No results found for any visits on 01/14/22.  Assessment & Plan     ***  No follow-ups on file.      {provider attestation***:1}   Megan Mans, MD  [facility] [phone] (phone) [phone] (fax)  [facility]

## 2022-01-14 ENCOUNTER — Ambulatory Visit: Payer: PPO | Admitting: Family Medicine

## 2022-01-24 ENCOUNTER — Ambulatory Visit
Admission: RE | Admit: 2022-01-24 | Discharge: 2022-01-24 | Disposition: A | Discharge status: Home / Self Care | Payer: PPO | Source: Ambulatory Visit | Attending: Family Medicine | Admitting: Family Medicine

## 2022-01-24 DIAGNOSIS — Z1231 Encounter for screening mammogram for malignant neoplasm of breast: Secondary | ICD-10-CM | POA: Insufficient documentation

## 2022-01-27 ENCOUNTER — Inpatient Hospital Stay
Admission: RE | Admit: 2022-01-27 | Discharge: 2022-01-27 | Disposition: A | Discharge status: Home / Self Care | Payer: Self-pay | Source: Ambulatory Visit | Attending: *Deleted | Admitting: *Deleted

## 2022-01-27 ENCOUNTER — Other Ambulatory Visit: Payer: Self-pay | Admitting: *Deleted

## 2022-01-27 DIAGNOSIS — Z1231 Encounter for screening mammogram for malignant neoplasm of breast: Secondary | ICD-10-CM

## 2022-03-12 ENCOUNTER — Encounter: Payer: Self-pay | Admitting: Family Medicine

## 2022-03-12 ENCOUNTER — Ambulatory Visit (INDEPENDENT_AMBULATORY_CARE_PROVIDER_SITE_OTHER): Payer: PPO | Admitting: Family Medicine

## 2022-03-12 VITALS — BP 132/94 | HR 98 | Temp 98.5°F | Resp 14 | Wt 225.0 lb

## 2022-03-12 DIAGNOSIS — F419 Anxiety disorder, unspecified: Secondary | ICD-10-CM | POA: Diagnosis not present

## 2022-03-12 DIAGNOSIS — F32A Depression, unspecified: Secondary | ICD-10-CM | POA: Diagnosis not present

## 2022-03-12 DIAGNOSIS — Z23 Encounter for immunization: Secondary | ICD-10-CM | POA: Diagnosis not present

## 2022-03-12 DIAGNOSIS — I10 Essential (primary) hypertension: Secondary | ICD-10-CM | POA: Diagnosis not present

## 2022-03-12 DIAGNOSIS — K219 Gastro-esophageal reflux disease without esophagitis: Secondary | ICD-10-CM

## 2022-03-12 NOTE — Progress Notes (Signed)
I,Roshena L Chambers,acting as a scribe for Wilhemena Durie, MD.,have documented all relevant documentation on the behalf of Wilhemena Durie, MD,as directed by  Wilhemena Durie, MD while in the presence of Wilhemena Durie, MD.   Established patient visit   Patient: Danielle Walsh   DOB: August 30, 1955   66 y.o. Female  MRN: [id number] Visit Date: 03/12/2022  Today's healthcare provider: Wilhemena Durie, MD   Chief Complaint  Patient presents with   Hypertension   Anxiety   Depression   Subjective    HPI  Patient comes in today for follow-up.  She feels well.  She is feeling much better on duloxetine.  Hypertension, follow-up  BP Readings from Last 3 Encounters:  03/12/22 (!) 132/94  12/09/21 137/89  10/16/21 (!) 151/114   Wt Readings from Last 3 Encounters:  03/12/22 225 lb (102.1 kg)  12/09/21 224 lb (101.6 kg)  10/16/21 227 lb 3.2 oz (103.1 kg)     She was last seen for hypertension 3 months ago.  Management since that visit includes; Good blood pressure control.  Outside blood pressures are checked and average 130/85.  Pertinent labs Lab Results  Component Value Date   CHOL 178 12/09/2021   HDL 64 12/09/2021   LDLCALC 100 (H) 12/09/2021   TRIG 75 12/09/2021   CHOLHDL 2.8 12/09/2021   Lab Results  Component Value Date   NA 143 12/09/2021   K 4.1 12/09/2021   CREATININE 1.09 (H) 12/09/2021   EGFR 56 (L) 12/09/2021   GLUCOSE 95 12/09/2021   TSH 2.220 12/09/2021     The 10-year ASCVD risk score (Arnett DK, et al., 2019) is: 7%  --------------------------------------------------------------------------------------------------- Anxiety and Depression:  She was last seen for anxiety 3 months ago. Changes made at last visit include; Increased duloxetine dose.  Recommended calling [facility] for counseling.   GAD-7 Results    12/09/2021   11:25 AM  GAD-7 Generalized Anxiety Disorder Screening Tool  1. Feeling Nervous, Anxious, or on Edge  1  2. Not Being Able to Stop or Control Worrying 1  3. Worrying Too Much About Different Things 1  4. Trouble Relaxing 1  5. Being So Restless it's Hard To Sit Still 0  6. Becoming Easily Annoyed or Irritable 2  7. Feeling Afraid As If Something Awful Might Happen 1  Total GAD-7 Score 7    PHQ-9 Scores    03/12/2022    2:32 PM 12/09/2021   11:24 AM 09/10/2020    4:09 PM  PHQ9 SCORE ONLY  PHQ-9 Total Score _0 ---------------------------------------------------------------------------------------------------   Medications: Outpatient Medications Prior to Visit  Medication Sig   acetaminophen (TYLENOL) 500 MG tablet Take 1 tablet (500 mg total) by mouth every 6 (six) hours as needed.   ALPRAZolam (XANAX) 0.5 MG tablet BID prn anxiety   atorvastatin (LIPITOR) 20 MG tablet TAKE ONE TABLET BY MOUTH DAILY AT 6 P.M.   cyclobenzaprine (FLEXERIL) 10 MG tablet TAKE ONE TABLET BY MOUTH THREE TIMES A DAY AS NEEDED FOR MUSCLE SPASMS   DULoxetine (CYMBALTA) 30 MG capsule Take 3 capsules (90 mg total) by mouth daily.   hyoscyamine (LEVBID) 0.375 MG 12 hr tablet Take 1 tablet (0.375 mg total) by mouth every 12 (twelve) hours as needed.   losartan-hydrochlorothiazide (HYZAAR) 50-12.5 MG tablet Take 1 tablet by mouth daily.   meclizine (ANTIVERT) 25 MG tablet Take by mouth.   omeprazole (PRILOSEC) 20 MG capsule TAKE  ONE CAPSULE BY MOUTH DAILY   temazepam (RESTORIL) 30 MG capsule TAKE 1 CAPSULE BY MOUTH AT BEDTIME prn sleep   No facility-administered medications prior to visit.    Review of Systems  Constitutional:  Negative for appetite change, chills, fatigue and fever.  Respiratory:  Negative for chest tightness and shortness of breath.   Cardiovascular:  Negative for chest pain and palpitations.  Gastrointestinal:  Negative for abdominal pain, nausea and vomiting.  Musculoskeletal:  Positive for joint swelling (in ankles).  Neurological:  Negative for dizziness and weakness.    Last  hemoglobin A1c Lab Results  Component Value Date   HGBA1C 5.8 (H) 04/06/2019       Objective    BP (!) 132/94 (BP Location: Right Arm, Patient Position: Sitting, Cuff Size: Large)   Pulse 98   Temp 98.5 F (36.9 C) (Oral)   Resp 14   Wt 225 lb (102.1 kg)   SpO2 96% Comment: room air  BMI 35.24 kg/m  BP Readings from Last 3 Encounters:  03/12/22 (!) 132/94  12/09/21 137/89  10/16/21 (!) 151/114   Wt Readings from Last 3 Encounters:  03/12/22 225 lb (102.1 kg)  12/09/21 224 lb (101.6 kg)  10/16/21 227 lb 3.2 oz (103.1 kg)      Physical Exam Vitals reviewed.  Constitutional:      General: She is not in acute distress.    Appearance: She is well-developed.  HENT:     Head: Normocephalic and atraumatic.     Right Ear: Hearing normal.     Left Ear: Hearing normal.     Nose: Nose normal.  Eyes:     General: Lids are normal. No scleral icterus.       Right eye: No discharge.        Left eye: No discharge.     Conjunctiva/sclera: Conjunctivae normal.  Cardiovascular:     Rate and Rhythm: Normal rate and regular rhythm.     Heart sounds: Normal heart sounds.  Pulmonary:     Effort: Pulmonary effort is normal. No respiratory distress.  Skin:    Findings: No lesion or rash.  Neurological:     General: No focal deficit present.     Mental Status: She is alert and oriented to person, place, and time.  Psychiatric:        Mood and Affect: Mood normal.        Speech: Speech normal.        Behavior: Behavior normal.        Thought Content: Thought content normal.        Judgment: Judgment normal.       No results found for any visits on 03/12/22.  Assessment & Plan     1. Essential (primary) hypertension Good home blood pressure readings.   2. Anxiety and depression Improved on duloxetine. Consider counseling with [facility] Meiers.  3. Need for influenza vaccination  - Flu Vaccine QUAD High Dose IM (Fluad)  4. Need for vaccination against Streptococcus  pneumoniae  - Pneumococcal conjugate vaccine 20-valent (PCV20)   No follow-ups on file.      I, Wilhemena Durie, MD, have reviewed all documentation for this visit. The documentation on 03/16/22 for the exam, diagnosis, procedures, and orders are all accurate and complete.    Vernesha Talbot Cranford Mon, MD  [facility] [phone] (phone) [phone] (fax)  [place]

## 2022-03-25 ENCOUNTER — Other Ambulatory Visit: Payer: Self-pay | Admitting: Family Medicine

## 2022-03-25 DIAGNOSIS — F419 Anxiety disorder, unspecified: Secondary | ICD-10-CM

## 2022-03-25 DIAGNOSIS — M62838 Other muscle spasm: Secondary | ICD-10-CM

## 2022-03-25 NOTE — Telephone Encounter (Signed)
Requested Prescriptions  Pending Prescriptions Disp Refills  . cyclobenzaprine (FLEXERIL) 10 MG tablet [Pharmacy Med Name: CYCLOBENZAPRINE 10 MG TABLET] 90 tablet 3    Sig: TAKE ONE TABLET BY MOUTH THREE TIMES A DAY AS NEEDED FOR MUSCLE SPASMS     Not Delegated - Analgesics:  Muscle Relaxants Failed - 03/25/2022 10:48 AM      Failed - This refill cannot be delegated      Passed - Valid encounter within last 6 months    Recent Outpatient Visits          1 week ago Essential (primary) hypertension   [facility] Jerrol Banana., [facility]   3 months ago Essential (primary) hypertension   [facility] Jerrol Banana., MD   5 months ago Strep pharyngitis   [facility] Mikey Kirschner, PA-C   6 months ago Subacute pansinusitis   [facility] Jerrol Banana., MD   6 months ago Sore throat   [facility] Jerrol Banana., MD      Future Appointments            In 1 week Jerrol Banana., MD [facility]           . DULoxetine (CYMBALTA) 30 MG capsule [Pharmacy Med Name: DULoxetine HCL DR 30 MG CAPSULE] 270 capsule 0    Sig: TAKE THREE CAPSULES BY MOUTH DAILY     Psychiatry: Antidepressants - SNRI - duloxetine Failed - 03/25/2022 10:48 AM      Failed - Cr in normal range and within 360 days    Creat  Date Value Ref Range Status  04/23/2017 1.02 (H) 0.50 - 0.99 mg/dL Final    Comment:    For patients >59 years of age, the reference limit for Creatinine is approximately 13% higher for people identified as African-American. .    Creatinine, Ser  Date Value Ref Range Status  12/09/2021 1.09 (H) 0.57 - 1.00 mg/dL Final         Failed - Last BP in normal range    BP Readings from Last 1 Encounters:  03/12/22 (!) 132/94         Passed - eGFR is 30 or above and within 360 days    GFR, Est African American  Date Value Ref Range Status  04/23/2017 69 > OR = 60  mL/min/1.52m Final   GFR calc Af Amer  Date Value Ref Range Status  01/02/2020 70 >59 mL/min/1.73 Final    Comment:    **[facility] currently reports eGFR in compliance with the current**   recommendations of the [employer] [facility] will   update reporting as new guidelines are published from the NKF-ASN   Task force.    GFR, Est Non African American  Date Value Ref Range Status  04/23/2017 60 > OR = 60 mL/min/1.722mFinal   GFR calc non Af Amer  Date Value Ref Range Status  01/02/2020 61 >59 mL/min/1.73 Final   eGFR  Date Value Ref Range Status  12/09/2021 56 (L) >59 mL/min/1.73 Final         Passed - Completed PHQ-2 or PHQ-9 in the last 360 days      Passed - Valid encounter within last 6 months    Recent Outpatient Visits          [facility]   3  months ago Essential (primary) hypertension   [facility] Jerrol Banana., MD   5 months ago Strep pharyngitis   [facility] Mikey Kirschner, [place]   6 months ago Subacute pansinusitis   [facility] Jerrol Banana., MD   6 months ago Sore throat   [facility] Jerrol Banana., MD      Future Appointments            In 1 week Jerrol Banana., MD [facility], [place]

## 2022-03-25 NOTE — Telephone Encounter (Signed)
Requested medications are due for refill today.  yes  Requested medications are on the active medications list.  yes  Last refill. 01/24/2021 #90 3 refills  Future visit scheduled.   yes  Notes to clinic.  Refills not delegated.    Requested Prescriptions  Pending Prescriptions Disp Refills   cyclobenzaprine (FLEXERIL) 10 MG tablet [Pharmacy Med Name: CYCLOBENZAPRINE 10 MG TABLET] 90 tablet 3    Sig: TAKE ONE TABLET BY MOUTH THREE TIMES A DAY AS NEEDED FOR MUSCLE SPASMS     Not Delegated - Analgesics:  Muscle Relaxants Failed - 03/25/2022 10:48 AM      Failed - This refill cannot be delegated      Passed - Valid encounter within last 6 months    Recent Outpatient Visits           1 week ago Essential (primary) hypertension   [facility] Jerrol Banana., MD   3 months ago Essential (primary) hypertension   [facility] Jerrol Banana., MD   5 months ago Strep pharyngitis   [facility] Mikey Kirschner, PA-C   6 months ago Subacute pansinusitis   [facility] Jerrol Banana., MD   6 months ago Sore throat   [facility] Jerrol Banana., MD       Future Appointments             In 1 week Jerrol Banana., MD [facility], [facility]            Signed Prescriptions Disp Refills   DULoxetine (CYMBALTA) 30 MG capsule 270 capsule 0    Sig: TAKE THREE CAPSULES BY MOUTH DAILY     Psychiatry: Antidepressants - SNRI - duloxetine Failed - 03/25/2022 10:48 AM      Failed - Cr in normal range and within 360 days    Creat  Date Value Ref Range Status  04/23/2017 1.02 (H) 0.50 - 0.99 mg/dL Final    Comment:    For patients >69 years of age, the reference limit for Creatinine is approximately 13% higher for people identified as African-American. .    Creatinine, Ser  Date Value Ref Range Status  12/09/2021 1.09 (H) 0.57 - 1.00 mg/dL Final         Failed -  Last BP in normal range    BP Readings from Last 1 Encounters:  03/12/22 (!) 132/94         Passed - eGFR is 30 or above and within 360 days    GFR, Est African American  Date Value Ref Range Status  04/23/2017 69 > OR = 60 mL/min/1.63m Final   GFR calc Af Amer  Date Value Ref Range Status  01/02/2020 70 >59 mL/min/1.73 Final    Comment:    **[facility] currently reports eGFR in compliance with the current**   recommendations of the [employer] [facility] will   update reporting as new guidelines are published from the NKF-ASN   Task force.    GFR, Est Non African American  Date Value Ref Range Status  04/23/2017 60 > OR = 60 mL/min/1.732mFinal   GFR calc non Af Amer  Date Value Ref Range Status  01/02/2020 61 >59 mL/min/1.73 Final   eGFR  Date Value Ref Range Status  12/09/2021 56 (L) >59 mL/min/1.73 Final         Passed - Completed PHQ-2 or PHQ-9 in the last 360 days  Passed - Valid encounter within last 6 months    Recent Outpatient Visits           1 week ago Essential (primary) hypertension   [facility] Jerrol Banana., MD   3 months ago Essential (primary) hypertension   [facility] Jerrol Banana., MD   5 months ago Strep pharyngitis   [facility] Mikey Kirschner, PA-C   6 months ago Subacute pansinusitis   [facility] Jerrol Banana., MD   6 months ago Sore throat   [facility] Jerrol Banana., MD       Future Appointments             In 1 week Jerrol Banana., MD [facility], [place]

## 2022-04-01 ENCOUNTER — Ambulatory Visit: Payer: PPO | Admitting: Family Medicine

## 2022-04-03 ENCOUNTER — Other Ambulatory Visit: Payer: Self-pay | Admitting: *Deleted

## 2022-04-03 DIAGNOSIS — K219 Gastro-esophageal reflux disease without esophagitis: Secondary | ICD-10-CM

## 2022-04-03 DIAGNOSIS — I10 Essential (primary) hypertension: Secondary | ICD-10-CM

## 2022-04-03 MED ORDER — LOSARTAN POTASSIUM-HCTZ 50-12.5 MG PO TABS: 1.0000 | ORAL_TABLET | Freq: Every day | ORAL | 3 refills | Status: AC

## 2022-04-03 MED ORDER — OMEPRAZOLE 20 MG PO CPDR: DELAYED_RELEASE_CAPSULE | ORAL | 3 refills | Status: AC

## 2022-04-21 ENCOUNTER — Ambulatory Visit: Payer: PPO | Admitting: Family Medicine

## 2022-07-03 ENCOUNTER — Ambulatory Visit: Admit: 2022-07-03 | Discharge status: Absent without leave | Payer: PPO

## 2022-10-28 DIAGNOSIS — I10 Essential (primary) hypertension: Secondary | ICD-10-CM | POA: Diagnosis not present

## 2022-10-28 DIAGNOSIS — E7800 Pure hypercholesterolemia, unspecified: Secondary | ICD-10-CM | POA: Diagnosis not present

## 2022-10-28 DIAGNOSIS — F419 Anxiety disorder, unspecified: Secondary | ICD-10-CM | POA: Diagnosis not present

## 2022-10-28 DIAGNOSIS — F32A Depression, unspecified: Secondary | ICD-10-CM | POA: Diagnosis not present

## 2023-02-04 ENCOUNTER — Telehealth: Payer: Self-pay

## 2023-02-04 NOTE — Telephone Encounter (Signed)
LVM for patient to call back [phone], or to call PCP office to schedule follow up apt. AS, CMA  

## 2023-03-23 DIAGNOSIS — K219 Gastro-esophageal reflux disease without esophagitis: Secondary | ICD-10-CM | POA: Diagnosis not present

## 2023-03-23 DIAGNOSIS — E7800 Pure hypercholesterolemia, unspecified: Secondary | ICD-10-CM | POA: Diagnosis not present

## 2023-03-23 DIAGNOSIS — I10 Essential (primary) hypertension: Secondary | ICD-10-CM | POA: Diagnosis not present

## 2023-03-25 DIAGNOSIS — N1832 Chronic kidney disease, stage 3b: Secondary | ICD-10-CM | POA: Diagnosis not present

## 2023-03-25 DIAGNOSIS — R42 Dizziness and giddiness: Secondary | ICD-10-CM | POA: Diagnosis not present

## 2023-03-25 DIAGNOSIS — F32A Depression, unspecified: Secondary | ICD-10-CM | POA: Diagnosis not present

## 2023-03-25 DIAGNOSIS — R739 Hyperglycemia, unspecified: Secondary | ICD-10-CM | POA: Diagnosis not present

## 2023-03-25 DIAGNOSIS — Z23 Encounter for immunization: Secondary | ICD-10-CM | POA: Diagnosis not present

## 2023-03-25 DIAGNOSIS — F419 Anxiety disorder, unspecified: Secondary | ICD-10-CM | POA: Diagnosis not present

## 2023-03-25 DIAGNOSIS — Z0000 Encounter for general adult medical examination without abnormal findings: Secondary | ICD-10-CM | POA: Diagnosis not present

## 2023-05-14 DIAGNOSIS — Z6835 Body mass index (BMI) 35.0-35.9, adult: Secondary | ICD-10-CM | POA: Diagnosis not present

## 2023-05-14 DIAGNOSIS — F32A Depression, unspecified: Secondary | ICD-10-CM | POA: Diagnosis not present

## 2023-05-14 DIAGNOSIS — E7800 Pure hypercholesterolemia, unspecified: Secondary | ICD-10-CM | POA: Diagnosis not present

## 2023-05-14 DIAGNOSIS — F419 Anxiety disorder, unspecified: Secondary | ICD-10-CM | POA: Diagnosis not present

## 2023-05-14 DIAGNOSIS — K219 Gastro-esophageal reflux disease without esophagitis: Secondary | ICD-10-CM | POA: Diagnosis not present

## 2023-05-14 DIAGNOSIS — E66812 Obesity, class 2: Secondary | ICD-10-CM | POA: Diagnosis not present

## 2023-05-14 DIAGNOSIS — N1832 Chronic kidney disease, stage 3b: Secondary | ICD-10-CM | POA: Diagnosis not present

## 2023-05-14 DIAGNOSIS — R739 Hyperglycemia, unspecified: Secondary | ICD-10-CM | POA: Diagnosis not present

## 2023-05-14 DIAGNOSIS — I10 Essential (primary) hypertension: Secondary | ICD-10-CM | POA: Diagnosis not present

## 2023-05-18 DIAGNOSIS — H903 Sensorineural hearing loss, bilateral: Secondary | ICD-10-CM | POA: Diagnosis not present

## 2023-06-02 ENCOUNTER — Telehealth: Payer: Self-pay | Admitting: Pharmacist

## 2023-06-02 NOTE — Progress Notes (Signed)
   06/02/2023  Patient ID: Danielle Walsh, female   DOB: Oct 18, 1955, 67 y.o.   MRN: [id number]  Pharmacy Quality Measure Review  This patient is appearing on a report for being at risk of failing the adherence measure for cholesterol (statin) medications this calendar year.   Medication: Atorvastatin 20mg  Last fill date: 05/27/23 for 90 day supply  Insurance report was not up to date. No action needed at this time.    Called and left voicemail to confirm pick-up, however system is showing it was picked up or at least filled on 05/27/23.    Marlowe Aschoff, PharmD [facility] Phone Number: [phone]

## 2023-08-13 ENCOUNTER — Other Ambulatory Visit: Payer: Self-pay | Admitting: Nephrology

## 2023-08-13 DIAGNOSIS — N1832 Chronic kidney disease, stage 3b: Secondary | ICD-10-CM

## 2023-08-20 ENCOUNTER — Ambulatory Visit
Admission: RE | Admit: 2023-08-20 | Discharge: 2023-08-20 | Disposition: A | Discharge status: Home / Self Care | Payer: PPO | Source: Ambulatory Visit | Attending: Nephrology | Admitting: Nephrology

## 2023-08-20 DIAGNOSIS — N1832 Chronic kidney disease, stage 3b: Secondary | ICD-10-CM | POA: Diagnosis present

## 2023-12-22 IMAGING — CR DG CHEST 2V
1 series · 2 of 2 positions shown · non-contrast
Comparison: None.

CLINICAL DATA: Cough.  Hypoxia.

EXAM:
CHEST - 2 VIEW

[Series 1: dg chest 2 view · 0.14mm/px · 2 of 2 slices shown]
[im 1/2]
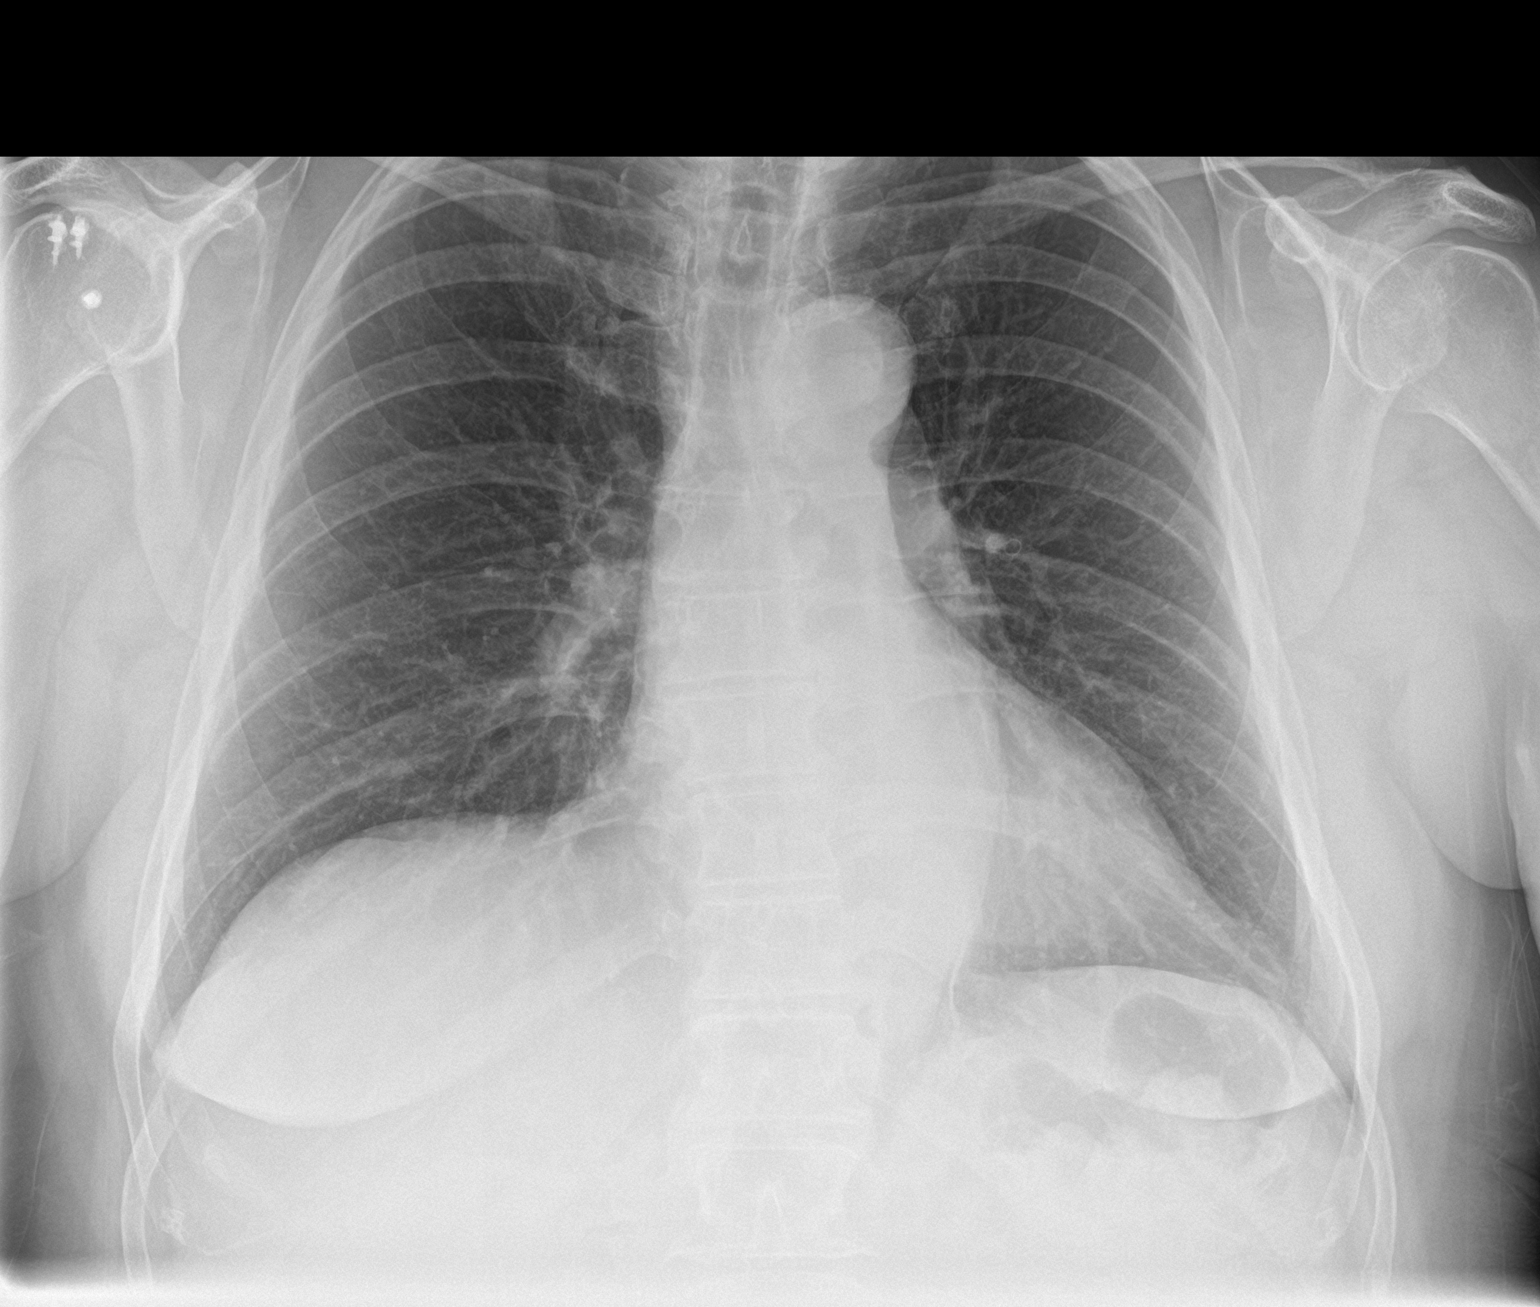
[im 2/2]
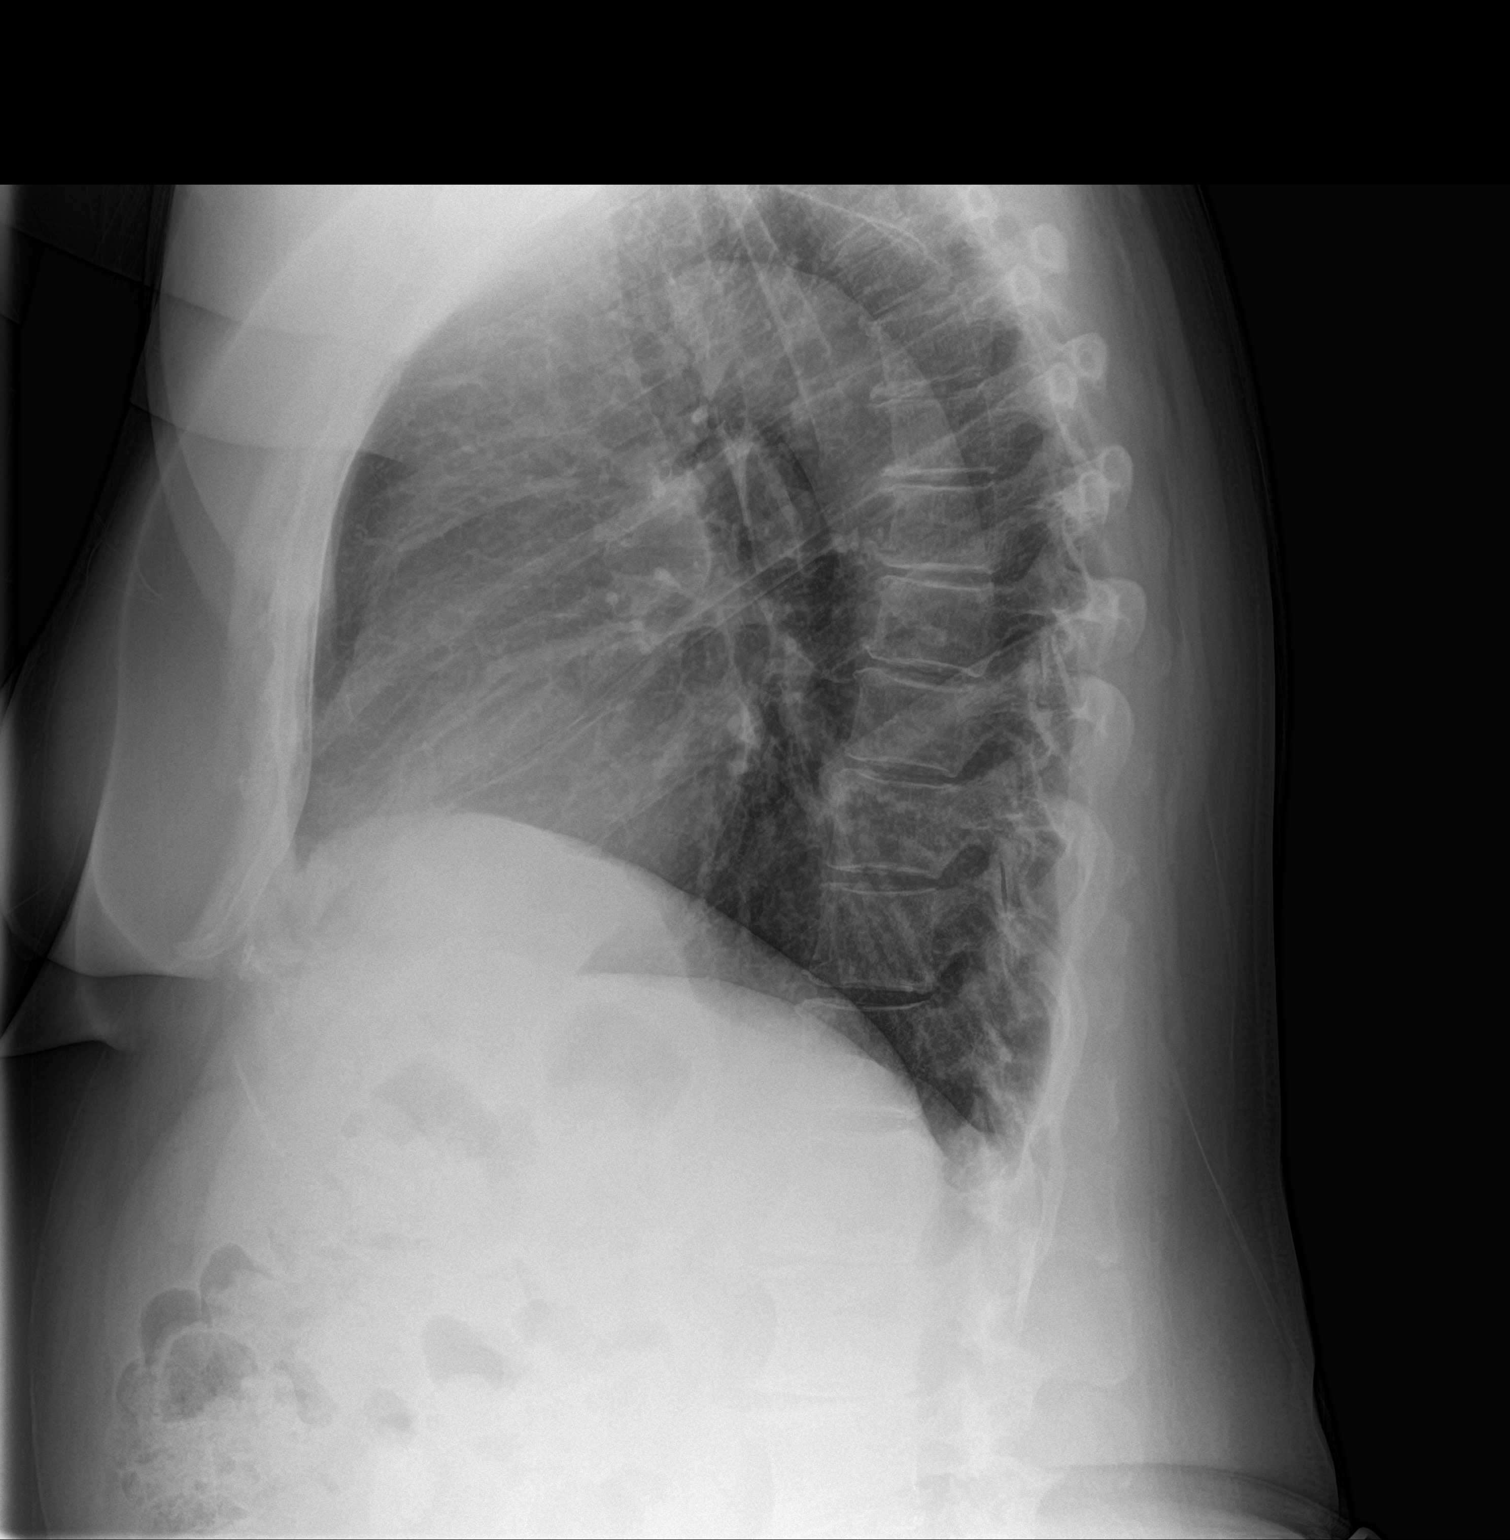

[2 of 2 positions shown; findings below may reference images not displayed]

FINDINGS: The heart size and mediastinal contours are within normal limits.
Both lungs are clear. The visualized skeletal structures are
unremarkable.
IMPRESSION: No active cardiopulmonary disease.

## 2024-04-13 ENCOUNTER — Other Ambulatory Visit: Payer: Self-pay | Admitting: Family Medicine

## 2024-04-13 DIAGNOSIS — Z1231 Encounter for screening mammogram for malignant neoplasm of breast: Secondary | ICD-10-CM

## 2024-07-12 ENCOUNTER — Other Ambulatory Visit: Payer: Self-pay

## 2024-07-12 ENCOUNTER — Other Ambulatory Visit (HOSPITAL_COMMUNITY): Payer: Self-pay

## 2024-07-13 ENCOUNTER — Other Ambulatory Visit: Payer: Self-pay

## 2024-07-13 MED ORDER — DULOXETINE HCL 30 MG PO CPEP
90.0000 mg | ORAL_CAPSULE | Freq: Every day | ORAL | 1 refills | Status: AC
  Filled 2024-07-13: qty 270, 90d supply, fill #0

## 2024-07-14 ENCOUNTER — Other Ambulatory Visit: Payer: Self-pay

## 2024-07-21 ENCOUNTER — Other Ambulatory Visit: Payer: Self-pay

## 2024-07-21 MED ORDER — DOXYCYCLINE HYCLATE 100 MG PO CAPS
100.0000 mg | ORAL_CAPSULE | Freq: Two times a day (BID) | ORAL | 0 refills | Status: AC
  Filled 2024-07-21: qty 20, 10d supply, fill #0

## 2024-07-21 MED ORDER — BENZONATATE 200 MG PO CAPS
200.0000 mg | ORAL_CAPSULE | Freq: Three times a day (TID) | ORAL | 0 refills | Status: AC | PRN
  Filled 2024-07-21: qty 30, 10d supply, fill #0

## 2024-08-03 ENCOUNTER — Other Ambulatory Visit: Payer: Self-pay

## 2024-08-03 MED ORDER — OMEPRAZOLE 20 MG PO CPDR
20.0000 mg | DELAYED_RELEASE_CAPSULE | Freq: Every day | ORAL | 3 refills | Status: AC
  Filled 2024-08-03 – 2024-08-04 (×2): qty 90, 90d supply, fill #0

## 2024-08-04 ENCOUNTER — Other Ambulatory Visit: Payer: Self-pay

## 2024-09-05 ENCOUNTER — Encounter
